# Patient Record
Sex: Female | Born: 1970 | Race: White | Hispanic: No | Marital: Married | Smoking: Never smoker
Health system: Southern US, Community
[De-identification: ages and names within clinical notes are randomized; demographics above are authoritative.]

## PROBLEM LIST (undated history)

## (undated) DIAGNOSIS — T7840XA Allergy, unspecified, initial encounter: Secondary | ICD-10-CM

## (undated) HISTORY — DX: Allergy, unspecified, initial encounter: T78.40XA

---

## 1998-05-19 ENCOUNTER — Inpatient Hospital Stay (HOSPITAL_COMMUNITY): Admission: AD | Admit: 1998-05-19 | Discharge: 1998-05-21 | Payer: Self-pay | Admitting: *Deleted

## 1998-06-15 ENCOUNTER — Encounter (HOSPITAL_COMMUNITY): Admission: RE | Admit: 1998-06-15 | Discharge: 1998-09-13 | Payer: Self-pay | Admitting: *Deleted

## 2000-08-15 ENCOUNTER — Other Ambulatory Visit: Admission: RE | Admit: 2000-08-15 | Discharge: 2000-08-15 | Payer: Self-pay | Admitting: Obstetrics and Gynecology

## 2001-03-23 ENCOUNTER — Inpatient Hospital Stay (HOSPITAL_COMMUNITY): Admission: AD | Admit: 2001-03-23 | Discharge: 2001-03-25 | Payer: Self-pay | Admitting: Obstetrics and Gynecology

## 2002-07-20 ENCOUNTER — Other Ambulatory Visit: Admission: RE | Admit: 2002-07-20 | Discharge: 2002-07-20 | Payer: Self-pay | Admitting: Family Medicine

## 2003-12-27 ENCOUNTER — Other Ambulatory Visit: Admission: RE | Admit: 2003-12-27 | Discharge: 2003-12-27 | Payer: Self-pay | Admitting: Internal Medicine

## 2003-12-28 ENCOUNTER — Encounter: Admission: RE | Admit: 2003-12-28 | Discharge: 2003-12-28 | Payer: Self-pay | Admitting: Family Medicine

## 2004-02-13 ENCOUNTER — Emergency Department (HOSPITAL_COMMUNITY): Admission: EM | Admit: 2004-02-13 | Discharge: 2004-02-13 | Payer: Self-pay | Admitting: Emergency Medicine

## 2004-06-09 DIAGNOSIS — E042 Nontoxic multinodular goiter: Secondary | ICD-10-CM | POA: Insufficient documentation

## 2005-02-19 ENCOUNTER — Ambulatory Visit: Payer: Self-pay | Admitting: Family Medicine

## 2005-04-03 ENCOUNTER — Ambulatory Visit: Payer: Self-pay | Admitting: Family Medicine

## 2005-04-03 ENCOUNTER — Other Ambulatory Visit: Admission: RE | Admit: 2005-04-03 | Discharge: 2005-04-03 | Payer: Self-pay | Admitting: Family Medicine

## 2005-04-05 ENCOUNTER — Encounter: Admission: RE | Admit: 2005-04-05 | Discharge: 2005-04-05 | Payer: Self-pay | Admitting: Family Medicine

## 2005-04-24 ENCOUNTER — Ambulatory Visit: Payer: Self-pay | Admitting: Endocrinology

## 2005-05-03 ENCOUNTER — Encounter: Admission: RE | Admit: 2005-05-03 | Discharge: 2005-05-03 | Payer: Self-pay | Admitting: Endocrinology

## 2005-05-03 ENCOUNTER — Encounter (INDEPENDENT_AMBULATORY_CARE_PROVIDER_SITE_OTHER): Payer: Self-pay | Admitting: *Deleted

## 2005-05-03 ENCOUNTER — Other Ambulatory Visit: Admission: RE | Admit: 2005-05-03 | Discharge: 2005-05-03 | Payer: Self-pay | Admitting: Interventional Radiology

## 2005-06-27 ENCOUNTER — Ambulatory Visit: Payer: Self-pay | Admitting: Family Medicine

## 2006-02-19 ENCOUNTER — Ambulatory Visit: Payer: Self-pay | Admitting: Family Medicine

## 2006-04-15 ENCOUNTER — Other Ambulatory Visit: Admission: RE | Admit: 2006-04-15 | Discharge: 2006-04-15 | Payer: Self-pay | Admitting: Family Medicine

## 2006-04-15 ENCOUNTER — Ambulatory Visit: Payer: Self-pay | Admitting: Family Medicine

## 2006-04-30 ENCOUNTER — Ambulatory Visit: Payer: Self-pay | Admitting: Family Medicine

## 2006-06-04 ENCOUNTER — Ambulatory Visit: Payer: Self-pay | Admitting: Family Medicine

## 2006-06-04 ENCOUNTER — Encounter (INDEPENDENT_AMBULATORY_CARE_PROVIDER_SITE_OTHER): Payer: Self-pay | Admitting: Specialist

## 2006-07-09 ENCOUNTER — Ambulatory Visit: Admission: RE | Admit: 2006-07-09 | Discharge: 2006-07-09 | Payer: Self-pay | Admitting: Gynecologic Oncology

## 2006-07-10 HISTORY — PX: OTHER SURGICAL HISTORY: SHX169

## 2006-09-09 ENCOUNTER — Encounter: Payer: Self-pay | Admitting: Family Medicine

## 2006-09-09 LAB — CONVERTED CEMR LAB

## 2006-09-10 ENCOUNTER — Ambulatory Visit: Admission: RE | Admit: 2006-09-10 | Discharge: 2006-09-10 | Payer: Self-pay | Admitting: Gynecologic Oncology

## 2007-04-11 ENCOUNTER — Encounter: Payer: Self-pay | Admitting: Family Medicine

## 2007-04-11 DIAGNOSIS — J309 Allergic rhinitis, unspecified: Secondary | ICD-10-CM | POA: Insufficient documentation

## 2007-04-27 DIAGNOSIS — L03119 Cellulitis of unspecified part of limb: Secondary | ICD-10-CM

## 2007-04-27 DIAGNOSIS — L02419 Cutaneous abscess of limb, unspecified: Secondary | ICD-10-CM | POA: Insufficient documentation

## 2007-04-30 ENCOUNTER — Ambulatory Visit: Payer: Self-pay | Admitting: Internal Medicine

## 2007-05-15 ENCOUNTER — Encounter (INDEPENDENT_AMBULATORY_CARE_PROVIDER_SITE_OTHER): Payer: Self-pay | Admitting: Internal Medicine

## 2007-05-15 ENCOUNTER — Other Ambulatory Visit: Admission: RE | Admit: 2007-05-15 | Discharge: 2007-05-15 | Payer: Self-pay | Admitting: Family Medicine

## 2007-05-15 ENCOUNTER — Ambulatory Visit: Payer: Self-pay | Admitting: Family Medicine

## 2007-05-15 DIAGNOSIS — B351 Tinea unguium: Secondary | ICD-10-CM | POA: Insufficient documentation

## 2007-05-15 DIAGNOSIS — N760 Acute vaginitis: Secondary | ICD-10-CM | POA: Insufficient documentation

## 2007-05-15 LAB — CONVERTED CEMR LAB
Pap Smear: NORMAL
Whiff Test: NEGATIVE

## 2007-05-19 LAB — CONVERTED CEMR LAB
BUN: 11 mg/dL (ref 6–23)
CO2: 27 meq/L (ref 19–32)
Calcium: 9.4 mg/dL (ref 8.4–10.5)
Chloride: 108 meq/L (ref 96–112)
Creatinine, Ser: 0.7 mg/dL (ref 0.4–1.2)
GFR calc Af Amer: 121 mL/min
GFR calc non Af Amer: 100 mL/min
Glucose, Bld: 88 mg/dL (ref 70–99)
Potassium: 4.2 meq/L (ref 3.5–5.1)
Sodium: 141 meq/L (ref 135–145)
TSH: 0.7 microintl units/mL (ref 0.35–5.50)

## 2007-05-26 ENCOUNTER — Telehealth (INDEPENDENT_AMBULATORY_CARE_PROVIDER_SITE_OTHER): Payer: Self-pay | Admitting: *Deleted

## 2007-06-17 ENCOUNTER — Encounter (INDEPENDENT_AMBULATORY_CARE_PROVIDER_SITE_OTHER): Payer: Self-pay | Admitting: Internal Medicine

## 2007-06-20 ENCOUNTER — Telehealth (INDEPENDENT_AMBULATORY_CARE_PROVIDER_SITE_OTHER): Payer: Self-pay | Admitting: *Deleted

## 2007-06-21 ENCOUNTER — Ambulatory Visit: Payer: Self-pay | Admitting: Family Medicine

## 2007-06-21 ENCOUNTER — Encounter: Payer: Self-pay | Admitting: Family Medicine

## 2007-06-26 ENCOUNTER — Encounter (INDEPENDENT_AMBULATORY_CARE_PROVIDER_SITE_OTHER): Payer: Self-pay | Admitting: Internal Medicine

## 2007-06-27 ENCOUNTER — Encounter (INDEPENDENT_AMBULATORY_CARE_PROVIDER_SITE_OTHER): Payer: Self-pay | Admitting: Internal Medicine

## 2007-07-02 ENCOUNTER — Telehealth (INDEPENDENT_AMBULATORY_CARE_PROVIDER_SITE_OTHER): Payer: Self-pay | Admitting: *Deleted

## 2007-08-08 ENCOUNTER — Encounter: Payer: Self-pay | Admitting: Family Medicine

## 2007-09-12 ENCOUNTER — Ambulatory Visit: Payer: Self-pay | Admitting: Family Medicine

## 2007-09-17 ENCOUNTER — Telehealth (INDEPENDENT_AMBULATORY_CARE_PROVIDER_SITE_OTHER): Payer: Self-pay | Admitting: Internal Medicine

## 2007-09-18 ENCOUNTER — Telehealth (INDEPENDENT_AMBULATORY_CARE_PROVIDER_SITE_OTHER): Payer: Self-pay | Admitting: Internal Medicine

## 2007-09-18 ENCOUNTER — Encounter (INDEPENDENT_AMBULATORY_CARE_PROVIDER_SITE_OTHER): Payer: Self-pay | Admitting: Internal Medicine

## 2007-09-23 ENCOUNTER — Encounter: Payer: Self-pay | Admitting: Family Medicine

## 2007-09-23 ENCOUNTER — Ambulatory Visit: Payer: Self-pay | Admitting: Family Medicine

## 2008-05-11 ENCOUNTER — Encounter (INDEPENDENT_AMBULATORY_CARE_PROVIDER_SITE_OTHER): Payer: Self-pay | Admitting: Internal Medicine

## 2008-05-11 LAB — CONVERTED CEMR LAB: Pap Smear: NORMAL

## 2008-05-25 ENCOUNTER — Encounter (INDEPENDENT_AMBULATORY_CARE_PROVIDER_SITE_OTHER): Payer: Self-pay | Admitting: Internal Medicine

## 2008-05-25 ENCOUNTER — Ambulatory Visit: Payer: Self-pay | Admitting: Family Medicine

## 2008-05-25 ENCOUNTER — Other Ambulatory Visit: Admission: RE | Admit: 2008-05-25 | Discharge: 2008-05-25 | Payer: Self-pay | Admitting: Family Medicine

## 2008-05-26 LAB — CONVERTED CEMR LAB
BUN: 10 mg/dL (ref 6–23)
Basophils Absolute: 0 10*3/uL (ref 0.0–0.1)
Basophils Relative: 0.9 % (ref 0.0–1.0)
CO2: 26 meq/L (ref 19–32)
Calcium: 8.8 mg/dL (ref 8.4–10.5)
Chloride: 108 meq/L (ref 96–112)
Cholesterol: 166 mg/dL (ref 0–200)
Creatinine, Ser: 0.9 mg/dL (ref 0.4–1.2)
Eosinophils Absolute: 0.2 10*3/uL (ref 0.0–0.7)
Eosinophils Relative: 3.4 % (ref 0.0–5.0)
GFR calc Af Amer: 90 mL/min
GFR calc non Af Amer: 74 mL/min
Glucose, Bld: 100 mg/dL — ABNORMAL HIGH (ref 70–99)
HCT: 43.1 % (ref 36.0–46.0)
HDL: 40.5 mg/dL (ref >39.0)
Hemoglobin: 14.8 g/dL (ref 12.0–15.0)
LDL Cholesterol: 109 mg/dL — ABNORMAL HIGH (ref 0–99)
Lymphocytes Relative: 17.8 % (ref 12.0–46.0)
MCHC: 34.5 g/dL (ref 30.0–36.0)
MCV: 90.3 fL (ref 78.0–100.0)
Monocytes Absolute: 0.4 10*3/uL (ref 0.1–1.0)
Monocytes Relative: 6.8 % (ref 3.0–12.0)
Neutro Abs: 3.7 10*3/uL (ref 1.4–7.7)
Neutrophils Relative %: 71.1 % (ref 43.0–77.0)
Platelets: 223 10*3/uL (ref 150–400)
Potassium: 4.2 meq/L (ref 3.5–5.1)
RBC: 4.77 M/uL (ref 3.87–5.11)
RDW: 12.4 % (ref 11.5–14.6)
Sodium: 140 meq/L (ref 135–145)
TSH: 0.71 microintl units/mL (ref 0.35–5.50)
Total CHOL/HDL Ratio: 4.1
Triglycerides: 83 mg/dL (ref 0–149)
VLDL: 17 mg/dL (ref 0–40)
WBC: 5.2 10*3/uL (ref 4.5–10.5)

## 2008-06-02 ENCOUNTER — Encounter (INDEPENDENT_AMBULATORY_CARE_PROVIDER_SITE_OTHER): Payer: Self-pay | Admitting: Internal Medicine

## 2008-06-02 ENCOUNTER — Encounter (INDEPENDENT_AMBULATORY_CARE_PROVIDER_SITE_OTHER): Payer: Self-pay | Admitting: *Deleted

## 2008-10-13 ENCOUNTER — Ambulatory Visit: Payer: Self-pay | Admitting: Family Medicine

## 2008-10-13 ENCOUNTER — Telehealth (INDEPENDENT_AMBULATORY_CARE_PROVIDER_SITE_OTHER): Payer: Self-pay | Admitting: Internal Medicine

## 2008-10-13 LAB — CONVERTED CEMR LAB: KOH Prep: NEGATIVE

## 2009-01-10 DIAGNOSIS — R220 Localized swelling, mass and lump, head: Secondary | ICD-10-CM | POA: Insufficient documentation

## 2009-01-10 DIAGNOSIS — R221 Localized swelling, mass and lump, neck: Secondary | ICD-10-CM | POA: Insufficient documentation

## 2009-01-14 ENCOUNTER — Telehealth (INDEPENDENT_AMBULATORY_CARE_PROVIDER_SITE_OTHER): Payer: Self-pay | Admitting: Internal Medicine

## 2009-01-30 ENCOUNTER — Emergency Department (HOSPITAL_COMMUNITY): Admission: EM | Admit: 2009-01-30 | Discharge: 2009-01-30 | Payer: Self-pay | Admitting: Emergency Medicine

## 2009-01-30 ENCOUNTER — Telehealth: Payer: Self-pay | Admitting: Internal Medicine

## 2009-02-22 HISTORY — PX: TONGUE BIOPSY: SHX1075

## 2009-02-23 ENCOUNTER — Ambulatory Visit: Payer: Self-pay | Admitting: Family Medicine

## 2009-02-23 DIAGNOSIS — J020 Streptococcal pharyngitis: Secondary | ICD-10-CM | POA: Insufficient documentation

## 2009-02-23 LAB — CONVERTED CEMR LAB: Rapid Strep: POSITIVE

## 2009-05-26 ENCOUNTER — Ambulatory Visit: Payer: Self-pay | Admitting: Family Medicine

## 2009-05-26 ENCOUNTER — Other Ambulatory Visit: Admission: RE | Admit: 2009-05-26 | Discharge: 2009-05-26 | Payer: Self-pay | Admitting: Family Medicine

## 2009-05-26 ENCOUNTER — Encounter (INDEPENDENT_AMBULATORY_CARE_PROVIDER_SITE_OTHER): Payer: Self-pay | Admitting: Internal Medicine

## 2009-05-26 LAB — CONVERTED CEMR LAB
ALT: 16 units/L (ref 0–35)
AST: 20 units/L (ref 0–37)
Albumin: 4.2 g/dL (ref 3.5–5.2)
Alkaline Phosphatase: 70 units/L (ref 39–117)
BUN: 14 mg/dL (ref 6–23)
Basophils Absolute: 0 10*3/uL (ref 0.0–0.1)
Basophils Relative: 0.1 % (ref 0.0–3.0)
Bilirubin, Direct: 0 mg/dL (ref 0.0–0.3)
CO2: 27 meq/L (ref 19–32)
Calcium: 9.1 mg/dL (ref 8.4–10.5)
Chloride: 108 meq/L (ref 96–112)
Cholesterol: 189 mg/dL (ref 0–200)
Creatinine, Ser: 0.9 mg/dL (ref 0.4–1.2)
Eosinophils Absolute: 0 10*3/uL (ref 0.0–0.7)
Eosinophils Relative: 0.4 % (ref 0.0–5.0)
GFR calc non Af Amer: 74.01 mL/min (ref >60)
Glucose, Bld: 90 mg/dL (ref 70–99)
HCT: 42.8 % (ref 36.0–46.0)
HDL: 49.1 mg/dL (ref >39.00)
Hemoglobin: 15.3 g/dL — ABNORMAL HIGH (ref 12.0–15.0)
LDL Cholesterol: 119 mg/dL — ABNORMAL HIGH (ref 0–99)
Lymphocytes Relative: 16.6 % (ref 12.0–46.0)
Lymphs Abs: 0.9 10*3/uL (ref 0.7–4.0)
MCHC: 35.8 g/dL (ref 30.0–36.0)
MCV: 86.7 fL (ref 78.0–100.0)
Monocytes Absolute: 0.3 10*3/uL (ref 0.1–1.0)
Monocytes Relative: 5.1 % (ref 3.0–12.0)
Neutro Abs: 4.5 10*3/uL (ref 1.4–7.7)
Neutrophils Relative %: 77.8 % — ABNORMAL HIGH (ref 43.0–77.0)
Platelets: 224 10*3/uL (ref 150.0–400.0)
Potassium: 4.3 meq/L (ref 3.5–5.1)
RBC: 4.93 M/uL (ref 3.87–5.11)
RDW: 12.3 % (ref 11.5–14.6)
Sodium: 142 meq/L (ref 135–145)
TSH: 0.59 microintl units/mL (ref 0.35–5.50)
Total Bilirubin: 0.8 mg/dL (ref 0.3–1.2)
Total CHOL/HDL Ratio: 4
Total Protein: 7.1 g/dL (ref 6.0–8.3)
Triglycerides: 103 mg/dL (ref 0.0–149.0)
VLDL: 20.6 mg/dL (ref 0.0–40.0)
WBC: 5.7 10*3/uL (ref 4.5–10.5)

## 2009-05-31 ENCOUNTER — Encounter (INDEPENDENT_AMBULATORY_CARE_PROVIDER_SITE_OTHER): Payer: Self-pay | Admitting: *Deleted

## 2009-06-21 ENCOUNTER — Telehealth (INDEPENDENT_AMBULATORY_CARE_PROVIDER_SITE_OTHER): Payer: Self-pay | Admitting: Internal Medicine

## 2009-07-26 ENCOUNTER — Telehealth (INDEPENDENT_AMBULATORY_CARE_PROVIDER_SITE_OTHER): Payer: Self-pay | Admitting: Internal Medicine

## 2009-07-28 ENCOUNTER — Telehealth (INDEPENDENT_AMBULATORY_CARE_PROVIDER_SITE_OTHER): Payer: Self-pay | Admitting: Internal Medicine

## 2010-01-03 ENCOUNTER — Ambulatory Visit: Payer: Self-pay | Admitting: Family Medicine

## 2010-01-03 DIAGNOSIS — N390 Urinary tract infection, site not specified: Secondary | ICD-10-CM | POA: Insufficient documentation

## 2010-01-03 DIAGNOSIS — R300 Dysuria: Secondary | ICD-10-CM | POA: Insufficient documentation

## 2010-01-03 LAB — CONVERTED CEMR LAB
Bilirubin Urine: NEGATIVE
Glucose, Urine, Semiquant: NEGATIVE
Ketones, urine, test strip: NEGATIVE
Nitrite: NEGATIVE
Protein, U semiquant: NEGATIVE
Specific Gravity, Urine: >=1.03
Urobilinogen, UA: 0.2
WBC Urine, dipstick: NEGATIVE
pH: 6

## 2010-01-05 ENCOUNTER — Encounter: Payer: Self-pay | Admitting: Family Medicine

## 2010-03-27 ENCOUNTER — Ambulatory Visit: Payer: Self-pay | Admitting: Family Medicine

## 2010-03-27 LAB — CONVERTED CEMR LAB: Urobilinogen, UA: 0.2

## 2010-03-28 ENCOUNTER — Encounter: Payer: Self-pay | Admitting: Family Medicine

## 2010-05-22 ENCOUNTER — Telehealth (INDEPENDENT_AMBULATORY_CARE_PROVIDER_SITE_OTHER): Payer: Self-pay | Admitting: *Deleted

## 2010-05-23 ENCOUNTER — Ambulatory Visit: Payer: Self-pay | Admitting: Family Medicine

## 2010-05-24 LAB — CONVERTED CEMR LAB
ALT: 16 units/L (ref 0–35)
AST: 16 units/L (ref 0–37)
Albumin: 4.2 g/dL (ref 3.5–5.2)
Alkaline Phosphatase: 61 units/L (ref 39–117)
BUN: 14 mg/dL (ref 6–23)
Bilirubin, Direct: 0.1 mg/dL (ref 0.0–0.3)
CO2: 25 meq/L (ref 19–32)
Calcium: 9.1 mg/dL (ref 8.4–10.5)
Chloride: 109 meq/L (ref 96–112)
Cholesterol: 186 mg/dL (ref 0–200)
Creatinine, Ser: 0.8 mg/dL (ref 0.4–1.2)
GFR calc non Af Amer: 80.85 mL/min (ref >60)
Glucose, Bld: 89 mg/dL (ref 70–99)
HDL: 46.1 mg/dL (ref >39.00)
LDL Cholesterol: 119 mg/dL — ABNORMAL HIGH (ref 0–99)
Potassium: 4.4 meq/L (ref 3.5–5.1)
Sodium: 142 meq/L (ref 135–145)
Total Bilirubin: 0.5 mg/dL (ref 0.3–1.2)
Total CHOL/HDL Ratio: 4
Total Protein: 6.6 g/dL (ref 6.0–8.3)
Triglycerides: 103 mg/dL (ref 0.0–149.0)
VLDL: 20.6 mg/dL (ref 0.0–40.0)

## 2010-05-26 ENCOUNTER — Ambulatory Visit: Payer: Self-pay | Admitting: Family Medicine

## 2010-05-26 ENCOUNTER — Other Ambulatory Visit: Admission: RE | Admit: 2010-05-26 | Discharge: 2010-05-26 | Payer: Self-pay | Admitting: Family Medicine

## 2010-05-26 DIAGNOSIS — N898 Other specified noninflammatory disorders of vagina: Secondary | ICD-10-CM | POA: Insufficient documentation

## 2010-05-26 LAB — CONVERTED CEMR LAB
KOH Prep: NEGATIVE
Whiff Test: NEGATIVE

## 2010-06-02 ENCOUNTER — Encounter: Admission: RE | Admit: 2010-06-02 | Discharge: 2010-06-02 | Payer: Self-pay | Admitting: Family Medicine

## 2010-06-30 LAB — CONVERTED CEMR LAB: Pap Smear: NEGATIVE

## 2011-01-11 NOTE — Progress Notes (Signed)
Summary: Lab orders from 05/22/10 canceled lab  ---- Converted from flag ---- ---- 05/19/2010 1:56 PM, Ruthe Mannan MD wrote: BMET (V70.0), Fasting lipid, hepatic (V77.91)  ---- 05/19/2010 1:55 PM, Liane Comber CMA (AAMA) wrote: Hello! Pt is scheduled for cpx labs tomorrow, what labs to draw and dx codes? Thanks Tasha ------------------------------

## 2011-01-11 NOTE — Assessment & Plan Note (Signed)
Summary: YEARLY PHYSICAL/JRR   Vital Signs:  Patient profile:   40 year old female Height:      66.5 inches Weight:      178.38 pounds BMI:     28.46 Temp:     98 degrees F oral Pulse rate:   96 / minute Pulse rhythm:   regular BP sitting:   138 / 86  (left arm) Cuff size:   regular  Vitals Entered By: Lewanda Rife LPN (May 26, 2010 9:02 AM) CC: CPX LMP 03/2010   History of Present Illness: 40 yo here for CPX.  Well woman- no h/o abnormal pap smears. 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 No family h/o cervical, uterine or breast CA.  Due for pap today, tetanus. Up to date on all other prevetion.  Vaginal discharge- thick, white, itchy since she stopped her abx for UTI. No pain with intercourse, fever or chills.   Current Medications (verified): 1)  Seasonale 0.15-0.03 Mg Tabs (Levonorgest-Eth Estrad 91-Day) .... Take 1 Daily 2)  Azo Standard Maximum Strength 97.5 Mg Tabs (Phenazopyridine Hcl) .... Otc As Directed. 3)  Aleve 220 Mg Tabs (Naproxen Sodium) .... Otc As Directed. 4)  Clobetasol Propionate 0.05 % Oint (Clobetasol Propionate) .... Apply Once Daily To Vaginal Area As Needed 5)  Diflucan 150 Mg Tabs (Fluconazole) .Marland Kitchen.. 1 Tab By Mouth By Mouth X 1  Allergies (verified): No Known Drug Allergies  Past History:  Past Medical History: Last updated: 04/11/2007 Allergic rhinitis  Past Surgical History: Last updated: 02/23/2009 thyroid US neg. goiter, nodule vulvar dysplasia 07/2006 bx of tongue lesion 02/22/2009--oral surgeon  Family History: Last updated: 05/26/2009 Father: L&W Mother: L&W Siblings: 3 br--1 type 2 DM, obesity                        2 L&W              1 sis --L&W  DM- P uncle MI- Puncle in 62's CVA- 0 Prostate Cancer-0 Breast Cancer- 0 Ovarian Cancer- 0 Uterine Cancer- 0 Colon Cancer- 0 Drug/ ETOH Abuse-MGF--ETOH Depression- 0  both grandmothers died of CHF at advanced ages  Social History: Last updated:  05/25/2008 Marital Status: Married Children: 2 Occupation: Research officer, trade union --pre-K and K  Risk Factors: Caffeine Use: 0 (05/26/2009) Exercise: no (05/25/2008)  Risk Factors: Smoking Status: never (04/11/2007) Passive Smoke Exposure: no (05/25/2008)  Review of Systems      See HPI General:  Denies chills and fever. Eyes:  Denies blurring. ENT:  Denies difficulty swallowing. CV:  Denies chest pain or discomfort. Resp:  Denies shortness of breath. GI:  Denies abdominal pain, bloody stools, and change in bowel habits. GU:  Complains of discharge; denies abnormal vaginal bleeding and dysuria. MS:  Denies joint pain, joint redness, and joint swelling. Derm:  Denies rash. Neuro:  Denies headaches. Psych:  Denies anxiety and depression. Endo:  Denies cold intolerance and heat intolerance.  Physical Exam  General:  alert, well-developed, well-nourished, and well-hydrated.   Head:  normocephalic, atraumatic, and no abnormalities observed.   Eyes:  vision grossly intact, pupils equal, pupils round, and pupils reactive to light.   Ears:  R ear normal and L ear normal.   Nose:  no external deformity.   Mouth:  good dentition.   Lungs:  Normal respiratory effort, chest expands symmetrically. Lungs are clear to auscultation, no crackles or wheezes. Heart:  Normal rate and regular rhythm. S1 and S2 normal without gallop, murmur,  click, rub or other extra sounds. Abdomen:  Bowel sounds positive,abdomen soft and non-tender without masses, organomegaly or hernias noted. Genitalia:  Pelvic Exam:        External: normal female genitalia, mild vulvular erythema        Vagina: normal without lesions or masses        Cervix: normal without lesions or masses, thick white discharge in os        Adnexa: normal bimanual exam without masses or fullness        Uterus: normal by palpation        Pap smear: performed Msk:  No deformity or scoliosis noted of thoracic or lumbar spine.   Extremities:   No clubbing, cyanosis, edema, or deformity noted with normal full range of motion of all joints.   Skin:  Intact without suspicious lesions or rashes Psych:  normally interactive and good eye contact.     Impression & Recommendations:  Problem # 1:  WELL WOMAN (ICD-V70.0) Reviewed preventive care protocols, scheduled due services, and updated immunizations Discussed nutrition, exercise, diet, and healthy lifestyle.  Set up mammogram today.  Problem # 2:  VAGINAL DISCHARGE (ICD-623.5) Assessment: New Wet prep consistent with yeast, diflucan 150 mg by mouth x 1. Orders: Wet Prep ([id number])  Complete Medication List: 1)  Seasonale 0.15-0.03 Mg Tabs (Levonorgest-eth estrad 91-day) .... Take 1 daily 2)  Azo Standard Maximum Strength 97.5 Mg Tabs (Phenazopyridine hcl) .... Otc as directed. 3)  Aleve 220 Mg Tabs (Naproxen sodium) .... Otc as directed. 4)  Clobetasol Propionate 0.05 % Oint (Clobetasol propionate) .... Apply once daily to vaginal area as needed 5)  Diflucan 150 Mg Tabs (Fluconazole) .Marland Kitchen.. 1 tab by mouth by mouth x 1  Other Orders: Radiology Referral (Radiology)  Patient Instructions: 1)  Great to see you. 2)  Please stop by to see Shirlee Limerick on your way out to set up your mammogram. Prescriptions: DIFLUCAN 150 MG TABS (FLUCONAZOLE) 1 tab by mouth by mouth x 1  #1 x 0   Entered and Authorized by:   Ruthe Mannan MD   Signed by:   Ruthe Mannan MD on 05/26/2010   Method used:   [facility]        [facility] [id number]       [address]       [place], [place]  [address]       Ph: [phone] or [phone]       Fax: [phone]   RxID:   [id number]   Current Allergies (reviewed today): No known allergies     Prevention & Chronic Care Immunizations   Influenza vaccine: Not documented    Tetanus booster: 12/10/1994: Td   Td booster deferral: Not indicated  (05/26/2010)   Tetanus booster due: 12/10/2004    Pneumococcal vaccine: Not  documented  Other Screening   Pap smear: NEGATIVE FOR INTRAEPITHELIAL LESIONS OR MALIGNANCY.  (05/26/2009)   Pap smear action/deferral: Ordered  (05/26/2010)   Pap smear due: 05/2009    Mammogram: Not documented   Mammogram action/deferral: Ordered  (05/26/2010)   Smoking status: never  (04/11/2007)  Lipids   Total Cholesterol: 186  (05/23/2010)   LDL: 119  (05/23/2010)   LDL Direct: Not documented   HDL: 46.10  (05/23/2010)   Triglycerides: 103.0  (05/23/2010)   Lipid panel due: 05/27/2011   Nursing Instructions: Pap smear today Schedule screening mammogram (see order)   Laboratory Results    Wet [place]/KOH Source: vaginal WBC/hpf 5-10  Negative whiff Yeast/hpf many KOH Negative Trichomonas/hpf none

## 2011-01-11 NOTE — Assessment & Plan Note (Signed)
Summary: UTI / LFW   Vital Signs:  Patient profile:   40 year old female Height:      66.5 inches Weight:      181.38 pounds BMI:     28.94 Temp:     97.9 degrees F oral Pulse rate:   84 / minute Pulse rhythm:   regular BP sitting:   144 / 90  (left arm) Cuff size:   regular  Vitals Entered By: Lewanda Rife LPN (March 27, 2010 2:50 PM) CC: ?UTI burning and pain upon urination with frequency   History of Present Illness: 40 yo with 1 day of dysuria, increased urinary frequency, and suprapubic pressure. Taking Azo with relief of symptoms. No fevers, chills, nausea, vomiting or back pain.  Similar symptoms in January.  Prior to January, cannot remember last time she had UTI.  Current Medications (verified): 1)  Seasonale 0.15-0.03 Mg Tabs (Levonorgest-Eth Estrad 91-Day) .... Take 1 Daily 2)  Nystatin 100000 Unit/gm Crea (Nystatin) .... Apply Two Times A Day To Vaginal Area As Needed 3)  Azo Standard Maximum Strength 97.5 Mg Tabs (Phenazopyridine Hcl) .... Otc As Directed. 4)  Aleve 220 Mg Tabs (Naproxen Sodium) .... Otc As Directed. 5)  Cipro 500 Mg Tabs (Ciprofloxacin Hcl) .Marland Kitchen.. 1 By Mouth 2 Times Daily X 7 Days  Allergies (verified): No Known Drug Allergies  Review of Systems      See HPI General:  Denies chills and fever. GI:  Denies abdominal pain, nausea, and vomiting. GU:  Complains of dysuria and urinary frequency; denies hematuria, incontinence, and urinary hesitancy.  Physical Exam  General:  alert, well-developed, well-nourished, and well-hydrated.   Abdomen:  mild suprapubic tenderness NO CVA tenderness Psych:  normally interactive and good eye contact.     Impression & Recommendations:  Problem # 1:  DYSURIA (ICD-788.1) Assessment New Likely UTI.  Unable to read UA due to Azo.  Will send for culture.   Treat with Cipro 500 mg two times a day x 7 days. Her updated medication list for this problem includes:    Azo Standard Maximum Strength 97.5 Mg Tabs  (Phenazopyridine hcl) ..... Otc as directed.    Cipro 500 Mg Tabs (Ciprofloxacin hcl) .Marland Kitchen... 1 by mouth 2 times daily x 7 days  Orders: T-Culture, Urine ([id number])  Complete Medication List: 1)  Seasonale 0.15-0.03 Mg Tabs (Levonorgest-eth estrad 91-day) .... Take 1 daily 2)  Nystatin 100000 Unit/gm Crea (Nystatin) .... Apply two times a day to vaginal area as needed 3)  Azo Standard Maximum Strength 97.5 Mg Tabs (Phenazopyridine hcl) .... Otc as directed. 4)  Aleve 220 Mg Tabs (Naproxen sodium) .... Otc as directed. 5)  Cipro 500 Mg Tabs (Ciprofloxacin hcl) .Marland Kitchen.. 1 by mouth 2 times daily x 7 days Prescriptions: CIPRO 500 MG TABS (CIPROFLOXACIN HCL) 1 by mouth 2 times daily x 7 days  #14 x 0   Entered and Authorized by:   Ruthe Mannan MD   Signed by:   Ruthe Mannan MD on 03/27/2010   Method used:   [facility]        [facility]       [address]       [place], [place]  [address]       Ph: [phone] or [phone]       Fax: [phone]   RxID:   [id number]   Current Allergies (reviewed today): No known allergies   Laboratory Results   Urine Tests  Date/Time Received:  March 27, 2010 2:56 PM  Date/Time Reported: March 27, 2010 2:56 PM   Routine Urinalysis   Color: orange Appearance: orange Glucose: orange   (Normal Range: Negative) Bilirubin: orange   (Normal Range: Negative) Ketone: orange   (Normal Range: Negative) Spec. Gravity: orange   (Normal Range: 1.003-1.035) Blood: orange   (Normal Range: Negative) pH: orange   (Normal Range: 5.0-8.0) Protein: orange   (Normal Range: Negative) Urobilinogen: 0.2   (Normal Range: 0-1) Nitrite: orange   (Normal Range: Negative) Leukocyte Esterace: orange   (Normal Range: Negative)        Appended Document: UTI / LFW

## 2011-01-11 NOTE — Assessment & Plan Note (Signed)
Summary: has uti   Vital Signs:  Patient profile:   40 year old female Height:      66.5 inches Weight:      178 pounds BMI:     28.40 Temp:     97.7 degrees F oral Pulse rate:   104 / minute Pulse rhythm:   regular BP sitting:   130 / 84  (left arm) Cuff size:   regular  Vitals Entered By: Delilah Shan CMA Duncan Dull) (January 03, 2010 8:59 AM) CC: ? UTI   History of Present Illness: 40 yo with 2 days of increased urinary frequency, dysuria, suprapubic pressure. Felt a little nauseated this morning, no vomiting. Mild right sided back pain when urinating. No fevers.   Has not had a UTI in over 2 years.  Current Medications (verified): 1)  Seasonale 0.15-0.03 Mg Tabs (Levonorgest-Eth Estrad 91-Day) .... Take 1 Daily 2)  Nystatin 100000 Unit/gm Crea (Nystatin) .... Apply Two Times A Day To Vaginal Area As Needed 3)  Naftin 1 % Crea (Naftifine Hcl) .... Apply Once Daily To Toenail As Needed 4)  Ketoconazole 2 % Crea (Ketoconazole) .... Apply To Rash Daily For 2-3 Weeks, Then As Needed 5)  Cipro 500 Mg Tabs (Ciprofloxacin Hcl) .Marland Kitchen.. 1 By Mouth 2 Times Daily X 7 Days  Allergies (verified): No Known Drug Allergies  Review of Systems      See HPI General:  Denies chills and fever. GI:  Complains of nausea; denies vomiting. GU:  Complains of dysuria and urinary frequency; denies hematuria and incontinence.  Physical Exam  General:  alert, well-developed, well-nourished, and well-hydrated.   Mouth:  MMM Abdomen:  mild suprapubic tenderness NO CVA tenderness Psych:  normally interactive and good eye contact.     Impression & Recommendations:  Problem # 1:  UTI (ICD-599.0) Assessment New UA pos for blood only.  Will treat for UTI as she is symptomatic and send for culture.  See patient instructions for details. Her updated medication list for this problem includes:    Cipro 500 Mg Tabs (Ciprofloxacin hcl) .Marland Kitchen... 1 by mouth 2 times daily x 7 days  Complete Medication  List: 1)  Seasonale 0.15-0.03 Mg Tabs (Levonorgest-eth estrad 91-day) .... Take 1 daily 2)  Nystatin 100000 Unit/gm Crea (Nystatin) .... Apply two times a day to vaginal area as needed 3)  Naftin 1 % Crea (Naftifine hcl) .... Apply once daily to toenail as needed 4)  Ketoconazole 2 % Crea (Ketoconazole) .... Apply to rash daily for 2-3 weeks, then as needed 5)  Cipro 500 Mg Tabs (Ciprofloxacin hcl) .Marland Kitchen.. 1 by mouth 2 times daily x 7 days  Other Orders: T-Culture, Urine ([id number])  Patient Instructions: 1)  Drink plenty of fluids up to 3-4 quarts a day. Cranberry juice is especially recommended in addition to large amounts of water. Avoid caffeine & carbonated drinks, they tend to irritate the bladder, Return in 3-5 days if you're not better: sooner if you're feeling worse.  Prescriptions: CIPRO 500 MG TABS (CIPROFLOXACIN HCL) 1 by mouth 2 times daily x 7 days  #14 x 0   Entered and Authorized by:   Ruthe Mannan MD   Signed by:   Ruthe Mannan MD on 01/03/2010   Method used:   Electronically to        [facility] [id number] (retail)       [address]       [place],  [place]  [address]       Ph: [phone]       Fax: [phone]   RxID:   [id number]   Current Allergies (reviewed today): No known allergies  Laboratory Results   Urine Tests   Date/Time Reported: January 03, 2010 9:12 AM   Routine Urinalysis   Color: orange Appearance: Hazy Glucose: negative   (Normal Range: Negative) Bilirubin: negative   (Normal Range: Negative) Ketone: negative   (Normal Range: Negative) Spec. Gravity: >=1.030   (Normal Range: 1.003-1.035) Blood: trace-intact   (Normal Range: Negative) pH: 6.0   (Normal Range: 5.0-8.0) Protein: negative   (Normal Range: Negative) Urobilinogen: 0.2   (Normal Range: 0-1) Nitrite: negative   (Normal Range: Negative) Leukocyte Esterace: negative   (Normal Range: Negative)

## 2011-03-27 LAB — POCT RAPID STREP A (OFFICE): Streptococcus, Group A Screen (Direct): POSITIVE — AB

## 2011-04-27 NOTE — Group Therapy Note (Signed)
[name], [name]                 ACCOUNT NO.:  [id number]   MEDICAL RECORD NO.:  [id number]          PATIENT TYPE:  POB   LOCATION:  [facility]                   FACILITY:  [facility]   PHYSICIAN:  Tinnie Gens, MD        DATE OF BIRTH:  06/15/1971   DATE OF SERVICE:  06/04/2006                                    CLINIC NOTE   CHIEF COMPLAINT:  Follow up.   The patient is a 40 year old, gravida 2, para 2, who was referred by Dr.  Milinda Antis for persistent yeast, who was treated with numerous medications on  several occasions for this yeast. I had previously sent her on ketoconazole  10 mg tablets for five days, Terazol cream and a three week course of every  other day of Diflucan. However, the patient continues to have similar  symptoms with vulvar itching, irritation, seems to be just on the side.  Additionally she has an onychomycosis that she thinks might be related to  this.  She is __________ .  She is trying to see currently if her pharmacy  will pay for Lamisil.  Given that she is no better it was thought that the  patient would be best served by punch biopsy today. This patient is seen at  [facility].   PROCEDURE:  The right labia majora was infiltrated with 1 mL of 1%  lidocaine. A punch biopsy was obtained without difficulty. Hemostasis was  obtained with silver nitrate. The patient tolerated the procedure well and  the biopsy was sent off for pathology.   IMPRESSION:  Vulvar irritation. Question resistant yeast. She has failed  numerous treatments. Will send the biopsy to confirm that diagnosis.   PLAN:  Will call the patient back with the diagnosis as soon as the  pathology returns and follow up treatment as needed from there.           ______________________________  Tinnie Gens, MD     TP/MEDQ  D:  06/04/2006  T:  06/04/2006  Job:  [id number]

## 2011-04-27 NOTE — Consult Note (Signed)
NAMEJULIETA, [name]                 ACCOUNT NO.:  [id number]   MEDICAL RECORD NO.:  [id number]          PATIENT TYPE:  OUT   LOCATION:  GYN                          FACILITY:  [facility]   PHYSICIAN:  [name]    DATE OF BIRTH:  [birth year]   DATE OF CONSULTATION:  07/09/2006  DATE OF DISCHARGE:                                   CONSULTATION   REFERRING PHYSICIAN:  Tanya S. Shawnie Carr, M.D.   PRIMARY PHYSICIAN:  [name], M.D.   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  [name] is a 40 year old gravida 2, para 2 was last cycle was a  week ago.  She states that she has been complaining of about a 1-year  history of discomfort on the right side which was felt to be most likely  secondary to yeast.  She has been treated for fungal infection several times  without resolution of her symptoms.  She was seen by Dr. 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  She did  have a normal Pap smear in May 2007.  Other than the itching, she is doing  fine.  She does admit to scratching significantly though never to the point  of causing any bleeding.  She states she scratches quite a bit at night  time.  There is really no significant aggravating or alleviating symptoms.  Other than the fact that she really has minimal to no discharge, it does  feel like the yeast infection otherwise.   MEDICATIONS:  Seasonale.   ALLERGIES:  None.   PAST SURGICAL HISTORY:  Two spontaneous vaginal deliveries.   PAST MEDICAL HISTORY:  None.   SOCIAL HISTORY:  She is married.  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  She has an 67-year-old son and 49-year-old  daughter.  She denies use of tobacco or alcohol.   FAMILY HISTORY:  Her mother and father both have hypertension.  Her father  had peptic ulcer disease.  She has  a brother with diabetes.   PHYSICAL EXAMINATION:  Weight 161 pounds.  Well-nourished, alert female in  no acute distress.  VULVA:  There is some erythema and edema consistent with itch-scratch cycle  on the right vulva.  Acetic acid was applied and the entire vulva was  closely inspected.  There is a small approximately 6 to 7 mm acetowhite  epithelial change in the inferior aspect of the right vulva.  Remainder the  vulva has no acetowhite epithelial changes.   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   PLAN:  I instructed her and she was shown her vulva with a mirror today to  use Aldara three times a week on the lower small portion of the vulva that  does have the  low grade dysplasia.  She will use it for 6 weeks time.  I did  counsel her that this may increase her symptoms for a period time, but then  they should get better with regards to this.  With regards to the more port  of prominent portion of the vulvitis, I believe this is more of a dermatitis  and a chronic itch and scratch.  For this I have encouraged her to use Dove  hypoallergenic soap, to avoid tight-fitting clothes.  We also gave her  prescription for Lotrisone which does complain of small antifungal as well  as steroid that she is to use.  She was counseled again trying to not  scratch as much as possible.  Her questions were answered and elicited to  her satisfaction.  She is very pleased that she will not have to consider  surgery at this standpoint.  She was to return to see me in 6 to 8 weeks for  follow-up.      [name]  Electronically Signed     PAG/MEDQ  D:  07/09/2006  T:  07/09/2006  Job:  [id number]   cc:   Telford Nab, R.N.  [address]  [place], [place] [address]   Idamae Schuller A. Tower, M.D. [facility]  [address] [place]  [place] [address]   Shelbie Proctor. Shawnie Carr, M.D.

## 2011-04-27 NOTE — H&P (Signed)
[facility]  Patient:    [name], [name]                          MRN: [id number] Adm. Date:  03/23/01 Attending:  Silverio Lay, M.D.                         History and Physical  REASON FOR ADMISSION:         Intrauterine pregnancy, at 40 weeks, with regular uterine contractions.  HISTORY OF PRESENT ILLNESS:   This is a 40 year old married, white female, gravida 2, para 1 with an due date of March 23, 2001, by ultrasound, who reports regular uterine contractions since 1:30 a.m.  She denies any leaking of fluid.  She denies any bleeding and reports good fetal activity.  She also denies any symptoms of pregnancy-induced hypertension.  She arrived at maternity admission at 4:30 a.m. with uterine contractions every 2-3 minutes, intensity 7-8/10.  Her vaginal examination, by the admitting nurse is, 4-5 cm, 90% effaced, 0 vertex.  PERTINENT LABORATORY DATA:    Reveals blood type B positive.  RPR nonreactive. Rubella-immune.  HBsAg nonreactive.  HIV nonreactive.  Chlamydia and gonorrhea negative.  A 16-week AFP within normal limits.  A 20-week ultrasound with a normal anatomy survey.  Anterior placenta and cervical length of 5.7 cm.  A 28-week glucose tolerance test was within normal limits and 35-week group B strep was negative.  Prenatal course was otherwise uneventful.  ALLERGIES:                    No known drug allergies.  PAST MEDICAL HISTORY:         June of 1999, vaginal delivery with low forceps at 40 weeks of a female infant, weighing 8 pounds 9 ounces.  FAMILY HISTORY:               Father borderline hypertension and diabetes. Brother with diabetes.  SOCIAL HISTORY:               Married, nonsmoker, works as a Diplomatic Services operational officer.  PHYSICAL EXAMINATION:  VITAL SIGNS:                  Vital signs normal with blood pressure 122/80, temperature normal.  HEENT:                        Head, eyes, ears, nose, and throat negative.  LUNGS:                         Clear.  HEART:                        Normal.  ABDOMEN:                      Gravid, nontender.  PELVIC:                       Vaginal examination 4-5 cm, 90% effaced, vertex, 0, bulging bag of membranes.  Fetal heart rate tracing reactive.  EXTREMITIES:                  Negative.  ASSESSMENT:                   Intrauterine pregnancy, at 40 weeks, instantaneous labor  with previous history of forceps delivery for large for gestational age.  PLAN:                         The patient is admitted to labor and delivery with routine orders.  May have epidural, spontaneous vaginal delivery expected. DD:  03/23/01 TD:  03/23/01 Job: [id number]

## 2011-04-27 NOTE — Consult Note (Signed)
NAMECORLEY, KOHLS                 ACCOUNT NO.:  [id number]   MEDICAL RECORD NO.:  [id number]          PATIENT TYPE:  OUT   LOCATION:  GYN                          FACILITY:  [facility]   PHYSICIAN:  Paola A. Duard Brady, MD    DATE OF BIRTH:  [birth year]   DATE OF CONSULTATION:  09/10/2006  DATE OF DISCHARGE:                                   CONSULTATION   HISTORY OF PRESENT ILLNESS:  Ms. Thomaston is a very pleasant 40 year old who was  initially seen by me on July 09, 2006.  She was referred by Dr. Shawnie Pons  secondary to a vulvar biopsy that revealed VIN.  At that time, the patient  was having significant vulvar symptoms.  On examination, she had significant  erythema and edema consistent with itch and scratch on the right vulva.  She  had a 6-7 mm acetowhite epithelial change on the inferior aspect of the  right vulva.  We recommended Aldara to that small lesion, as well as  Lotrisone cream and discontinuation of irritants.  She comes in today, and  she states that she is feeling much better.  She used the Aldara with no  significant symptoms.  She states that she believes the lesion is now gone.  She did use the Lotrisone for several days.  Her symptoms resolved.  They  again returned a couple of weeks ago for a day or two.  She used the  Lotrisone cream sparingly, and the symptoms again resolved.  She is overall  doing quite well and denies any significant complaints.   PHYSICAL EXAMINATION:  VITAL SIGNS:  Weight 159 pounds, blood pressure  118/80.  GENERAL:  Well-nourished, well-developed female in no acute distress.  PELVIC:  External genitalia - the vulva are much paler in appearance.  There  is no significant edema, overall appearing much healthier and normal.  Acetic acid was applied to the vulva, and there were no acetowhite  epithelial changes.   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   PLAN:  There is no evidence of recurrent or persistent dysplasia at this  point.  I have  discussed with the patient on doing self vulvar checks once a  month.  If she notices any lesions, she may either bring them up to Dr.  Tawni Levy attention or she can feel free to contact us.  In addition, I have  continued to encourage her to stop vulvar irritants.  She was using [place],  but now is not even using any soap when she bathes, and she states that that  may have had a  significant role in her symptomatic relief.  She can continue using the  Lotrisone cream sparingly.  We will release her from our office.  She knows  we will be happy to see her in the future should the need arise, but, in the  meantime, she will of course be following up with Dr. Shawnie Pons and Dr. Milinda Antis.      Paola A. Duard Brady, MD  Electronically Signed     PAG/MEDQ  D:  09/10/2006  T:  09/11/2006  Job:  [id number]   cc:   Marne A. Tower, MD  [address] [place], [place] [address]   Shelbie Proctor. Shawnie Pons, M.D.   Telford Nab, R.N.  [address]  [place], [place] [address]

## 2011-04-27 NOTE — Group Therapy Note (Signed)
[name], [name]                 ACCOUNT NO.:  [id number]   MEDICAL RECORD NO.:  [id number]          PATIENT TYPE:  WOC   LOCATION:  [facility]                   FACILITY:  [facility]   PHYSICIAN:  Tinnie Gens, MD        DATE OF BIRTH:  Apr 21, 1971   DATE OF SERVICE:  04/30/2006                                    CLINIC NOTE   CHIEF COMPLAINT:  Vulvar irritation.   HISTORY OF PRESENT ILLNESS:  The patient is a 40 year old, gravida 2, para  2, who is referred from Dr. Milinda Antis for resistant yeast.  She apparently has a  several month history of yeast.  She has gone through Diflucan, Monistat,  Viasorb, Desitin cream and none of these have completely made the irritation  better.  It seems to be just on the right side.  It is associated with  itching, irritation.  She denies vaginal discharge or problems with vaginal  irritation.  The patient has a history of vaginal yeast but never on the  outside.  The patient has had no recent history of antibiotics.  She says  she was treated for strep almost a year ago.  The patient had relief at one  time with Monistat three a day; however, it came right back and since not  helped.  Dr. Milinda Antis has done a scraping of this area and said it definitely  looks like yeast.  The patient is on Tri-Sprintec for birth control and has  had no problems with that.   PAST MEDICAL HISTORY:  Significant for goiter.   PAST SURGICAL HISTORY:  Negative.   MEDICATIONS:  Tri-Sprintec daily.   ALLERGIES:  None known.   OBSTETRICAL HISTORY:  G2, P2 with two vaginal deliveries.   GYN HISTORY:  Menarche at age 19.  Cycles monthly.  No history of abnormal  Pap smear.  Last Pap was May of 2007.   FAMILY HISTORY:  Diabetes, heart disease and bone cancer.   SOCIAL HISTORY:  No tobacco, alcohol or drug use.   REVIEW OF SYSTEMS:  A 14-point review of systems reviewed.  Please see GYN  history in the chart but it is basically positive only for vaginal odor and  itching.   PHYSICAL EXAMINATION:  Her vitals are as noted in the chart.  She is a well-  developed, well-nourished white female in no acute distress.  Her abdomen is  soft, nontender, and nondistended.  GU normal external female.  The patient  has a pale, pink, slightly raised erythematous rash on the right labia  majora.  It is well confined.  There are no satellite lesions.  BUS is  normal.  The vagina is pink and rugous.  Uterus is small and anteverted.  Adnexa without mass or tenderness.  Extremities no clubbing, cyanosis, or  edema.   Scraping is done with heat fixation that reveals no significant yeast seen.   IMPRESSION:  Probable yeast vulvar candidiasis.  Treat this with definitely  some ketoconazole.   PLAN:  1.  Ketoconazole 200 mg tablet x5 days and accompanied by Terazol cream.  2.  After  this, we will complete a two-week course of every other day      Diflucan and follow up in three weeks.  If her symptoms have failed to      resolve by then, we will do a punch biopsy just to confirm diagnosis.   Thank you for referring this patient to me.           ______________________________  Tinnie Gens, MD     TP/MEDQ  D:  04/30/2006  T:  05/01/2006  Job:  [id number]

## 2011-05-10 ENCOUNTER — Other Ambulatory Visit: Payer: Self-pay | Admitting: *Deleted

## 2011-05-10 ENCOUNTER — Other Ambulatory Visit: Payer: Self-pay | Admitting: Family Medicine

## 2011-05-10 DIAGNOSIS — Z1231 Encounter for screening mammogram for malignant neoplasm of breast: Secondary | ICD-10-CM

## 2011-05-11 MED ORDER — CLOBETASOL PROPIONATE 0.05 % EX OINT: TOPICAL_OINTMENT | Freq: Two times a day (BID) | CUTANEOUS | Status: AC

## 2011-05-14 NOTE — Telephone Encounter (Signed)
Left message on machine at home for patient to call back with the name of the pharmacy she uses.

## 2011-05-14 NOTE — Telephone Encounter (Signed)
Rx called to [facility] per patients request.

## 2011-05-22 ENCOUNTER — Other Ambulatory Visit: Payer: Self-pay | Admitting: Family Medicine

## 2011-05-22 DIAGNOSIS — Z0000 Encounter for general adult medical examination without abnormal findings: Secondary | ICD-10-CM

## 2011-05-22 DIAGNOSIS — Z136 Encounter for screening for cardiovascular disorders: Secondary | ICD-10-CM

## 2011-05-22 DIAGNOSIS — E049 Nontoxic goiter, unspecified: Secondary | ICD-10-CM

## 2011-05-25 ENCOUNTER — Encounter: Payer: Self-pay | Admitting: Family Medicine

## 2011-05-25 ENCOUNTER — Other Ambulatory Visit (INDEPENDENT_AMBULATORY_CARE_PROVIDER_SITE_OTHER): Payer: BC Managed Care – PPO | Admitting: Family Medicine

## 2011-05-25 DIAGNOSIS — Z0000 Encounter for general adult medical examination without abnormal findings: Secondary | ICD-10-CM

## 2011-05-25 DIAGNOSIS — Z1231 Encounter for screening mammogram for malignant neoplasm of breast: Secondary | ICD-10-CM

## 2011-05-25 DIAGNOSIS — Z136 Encounter for screening for cardiovascular disorders: Secondary | ICD-10-CM

## 2011-05-25 DIAGNOSIS — E049 Nontoxic goiter, unspecified: Secondary | ICD-10-CM

## 2011-05-25 LAB — BASIC METABOLIC PANEL
CO2: 25 mEq/L (ref 19–32)
Calcium: 9.3 mg/dL (ref 8.4–10.5)
Chloride: 108 mEq/L (ref 96–112)
GFR: 81.58 mL/min (ref >60.00)
Glucose, Bld: 95 mg/dL (ref 70–99)
Potassium: 4.4 mEq/L (ref 3.5–5.1)
Sodium: 141 mEq/L (ref 135–145)

## 2011-05-25 LAB — LIPID PANEL
Total CHOL/HDL Ratio: 4
Triglycerides: 139 mg/dL (ref 0.0–149.0)
VLDL: 27.8 mg/dL (ref 0.0–40.0)

## 2011-05-25 LAB — LDL CHOLESTEROL, DIRECT: Direct LDL: 137.3 mg/dL

## 2011-05-25 LAB — TSH: TSH: 1.1 u[IU]/mL (ref 0.35–5.50)

## 2011-05-29 ENCOUNTER — Ambulatory Visit (INDEPENDENT_AMBULATORY_CARE_PROVIDER_SITE_OTHER): Payer: BC Managed Care – PPO | Admitting: Family Medicine

## 2011-05-29 ENCOUNTER — Encounter: Payer: Self-pay | Admitting: Family Medicine

## 2011-05-29 VITALS — BP 140/90 | HR 68 | Temp 98.2°F | Ht 67.0 in | Wt 178.8 lb

## 2011-05-29 DIAGNOSIS — Z0000 Encounter for general adult medical examination without abnormal findings: Secondary | ICD-10-CM

## 2011-05-29 MED ORDER — NORETHINDRONE-ETH ESTRADIOL 1-35 MG-MCG PO TABS: 1.0000 | ORAL_TABLET | Freq: Every day | ORAL | Status: DC

## 2011-05-29 NOTE — Progress Notes (Signed)
40 yo here for CPX.  Well woman- no h/o abnormal pap smears. 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 Last pap smear 05/2010. No family h/o cervical, uterine or breast CA.  Mammogram scheduled for next week.    Up to date on all other prevetion.  Wants to try another OCP, having more break through bleeding.  Patient Active Problem List  Diagnoses  . STREPTOCOCCAL PHARYNGITIS  . ONYCHOMYCOSIS  . GOITER  . ALLERGIC RHINITIS  . UTI  . VULVITIS  . VAGINAL DISCHARGE  . CELLULITIS/ABSCESS, LEG  . SWELLING MASS OR LUMP IN HEAD AND NECK  . DYSURIA   Past Medical History  Diagnosis Date  . Allergy    Past Surgical History  Procedure Date  . 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 Tongue biopsy 02/22/2009    [facility] surgeon    Family History  Problem Relation Age of Onset  . Diabetes Brother   . Obesity Brother    Allergies not on file Current Outpatient Prescriptions on File Prior to Visit  Medication Sig Dispense Refill  . clobetasol (TEMOVATE) 0.05 % ointment Apply topically 2 (two) times daily.  30 g  0  . levonorgestrel-ethinyl estradiol (SEASONALE) 0.15-0.03 MG per tablet Take 1 tablet by mouth daily.        . naproxen sodium (ANAPROX) 220 MG tablet OTC as directed           Review of Systems       See HPI General:  Denies chills and fever. Eyes:  Denies blurring. ENT:  Denies difficulty swallowing. CV:  Denies chest pain or discomfort. Resp:  Denies shortness of breath. GI:  Denies abdominal pain, bloody stools, and change in bowel habits. GU:  Complains of discharge; denies abnormal vaginal bleeding and dysuria. MS:  Denies joint pain, joint redness, and joint swelling. Derm:  Denies rash. Neuro:  Denies headaches. Psych:  Denies anxiety and depression. Endo:  Denies cold intolerance and heat intolerance.  Physical Exam BP 140/90  Pulse 68  Temp(Src) 98.2 F (36.8 C) (Oral)  Ht 5\' 7"  (1.702 m)  Wt 178 lb 12 oz (81.08 kg)  BMI 28.00 kg/m2  LMP  05/02/2011  General:  alert, well-developed, well-nourished, and well-hydrated.   Head:  normocephalic, atraumatic, and no abnormalities observed.   Eyes:  vision grossly intact, pupils equal, pupils round, and pupils reactive to light.   Ears:  R ear normal and L ear normal.   Nose:  no external deformity.   Mouth:  good dentition.   Lungs:  Normal respiratory effort, chest expands symmetrically. Lungs are clear to auscultation, no crackles or wheezes. Heart:  Normal rate and regular rhythm. S1 and S2 normal without gallop, murmur, click, rub or other extra sounds. Abdomen:  Bowel sounds positive,abdomen soft and non-tender without masses, organomegaly or hernias noted. Msk:  No deformity or scoliosis noted of thoracic or lumbar spine.   Extremities:  No clubbing, cyanosis, edema, or deformity noted with normal full range of motion of all joints.   Skin:  Intact without suspicious lesions or rashes Psych:  normally interactive and good eye contact.    1. Routine general medical examination at a health care facility    Reviewed preventive care protocols, scheduled due services, and updated immunizations Discussed nutrition, exercise, diet, and healthy lifestyle.

## 2011-06-04 ENCOUNTER — Ambulatory Visit
Admission: RE | Admit: 2011-06-04 | Discharge: 2011-06-04 | Disposition: A | Discharge status: Home / Self Care | Payer: BC Managed Care – PPO | Source: Ambulatory Visit | Attending: Family Medicine | Admitting: Family Medicine

## 2011-06-05 ENCOUNTER — Encounter: Payer: Self-pay | Admitting: *Deleted

## 2011-07-05 ENCOUNTER — Other Ambulatory Visit: Payer: Self-pay | Admitting: *Deleted

## 2011-07-05 MED ORDER — NORETHINDRONE-ETH ESTRADIOL 1-35 MG-MCG PO TABS: 1.0000 | ORAL_TABLET | Freq: Every day | ORAL | Status: DC

## 2011-11-05 ENCOUNTER — Ambulatory Visit (INDEPENDENT_AMBULATORY_CARE_PROVIDER_SITE_OTHER): Payer: BC Managed Care – PPO | Admitting: Family Medicine

## 2011-11-05 ENCOUNTER — Encounter: Payer: Self-pay | Admitting: Family Medicine

## 2011-11-05 VITALS — BP 152/90 | HR 84 | Temp 98.2°F | Wt 188.8 lb

## 2011-11-05 DIAGNOSIS — R309 Painful micturition, unspecified: Secondary | ICD-10-CM

## 2011-11-05 DIAGNOSIS — N390 Urinary tract infection, site not specified: Secondary | ICD-10-CM

## 2011-11-05 DIAGNOSIS — R300 Dysuria: Secondary | ICD-10-CM

## 2011-11-05 LAB — POCT URINALYSIS DIPSTICK
Bilirubin, UA: UNDETERMINED
Glucose, UA: NEGATIVE
Spec Grav, UA: 1.02
pH, UA: 7

## 2011-11-05 MED ORDER — CIPROFLOXACIN HCL 500 MG PO TABS: 500.0000 mg | ORAL_TABLET | Freq: Two times a day (BID) | ORAL | Status: AC

## 2011-11-05 NOTE — Progress Notes (Signed)
  Subjective:    Patient ID: [name], female    DOB: [birth year], 40 y.o.   MRN: [id number]  HPI CC: UTI?  Today started with suprapubic pressure as well as dysuria.  Has started azo today.  No urgency.  No blood in urine.  No flank pain, feers/chills, nausea/vomiting.  Mother in hospital with burst appendix.  Last UTI was 3 mo ago.  Tends to get UTIs frequently.  That is when used antibiotics last.  Review of Systems Per HPI    Objective:   Physical Exam  Nursing note and vitals reviewed. Constitutional: She appears well-developed and well-nourished. No distress.  Abdominal: Soft. Bowel sounds are normal. She exhibits no distension and no mass. There is no tenderness. There is no rebound and no guarding.       No cva tenderness  Musculoskeletal: She exhibits no edema.  Skin: Skin is warm and dry.       Assessment & Plan:

## 2011-11-05 NOTE — Patient Instructions (Signed)
Urine wasn't a good specimen. I'd like [place] to recollect and will send culture. Treat for now with antibiotic - cipro twice daily for 5 days. Call us if not improved after antibiotic or any worsening. Push fluids and rest.

## 2011-11-05 NOTE — Assessment & Plan Note (Signed)
Concern for infection - initial collection contaminated - have recollected to send culture. Given sxs, treat as UTI with 5d course cipro. Update [place] if not improved after this.

## 2011-11-05 NOTE — Progress Notes (Signed)
Rx called in as directed due to e-scribe error. 

## 2011-11-07 LAB — URINE CULTURE

## 2011-11-08 ENCOUNTER — Telehealth: Payer: Self-pay | Admitting: Family Medicine

## 2011-11-08 MED ORDER — AMOXICILLIN 500 MG PO CAPS: 500.0000 mg | ORAL_CAPSULE | Freq: Three times a day (TID) | ORAL | Status: AC

## 2011-11-08 NOTE — Telephone Encounter (Signed)
Message left notifying patient of new Rx, to finish Cipro as well and to use 2nd form of BC while taking both. Instructed to call with any questions or concerns.

## 2011-11-08 NOTE — Telephone Encounter (Signed)
Please notify i'd like to add on amoxicillin twice daily for 3 days given bacteria that grew out of urine (finish cipro as well) To use 2nd form birth control while on abx

## 2012-05-20 ENCOUNTER — Other Ambulatory Visit: Payer: Self-pay | Admitting: Family Medicine

## 2012-05-20 DIAGNOSIS — Z0000 Encounter for general adult medical examination without abnormal findings: Secondary | ICD-10-CM

## 2012-05-20 DIAGNOSIS — Z1231 Encounter for screening mammogram for malignant neoplasm of breast: Secondary | ICD-10-CM

## 2012-05-20 DIAGNOSIS — Z136 Encounter for screening for cardiovascular disorders: Secondary | ICD-10-CM

## 2012-05-26 ENCOUNTER — Other Ambulatory Visit (INDEPENDENT_AMBULATORY_CARE_PROVIDER_SITE_OTHER): Payer: BC Managed Care – PPO

## 2012-05-26 DIAGNOSIS — Z136 Encounter for screening for cardiovascular disorders: Secondary | ICD-10-CM

## 2012-05-26 DIAGNOSIS — Z0000 Encounter for general adult medical examination without abnormal findings: Secondary | ICD-10-CM

## 2012-05-26 LAB — COMPREHENSIVE METABOLIC PANEL
AST: 24 U/L (ref 0–37)
BUN: 13 mg/dL (ref 6–23)
Calcium: 8.7 mg/dL (ref 8.4–10.5)
Chloride: 107 mEq/L (ref 96–112)
Creatinine, Ser: 0.9 mg/dL (ref 0.4–1.2)
GFR: 70.2 mL/min (ref >60.00)
Total Bilirubin: 0.3 mg/dL (ref 0.3–1.2)

## 2012-05-26 LAB — LIPID PANEL
HDL: 53.2 mg/dL (ref >39.00)
Total CHOL/HDL Ratio: 3
Triglycerides: 138 mg/dL (ref 0.0–149.0)
VLDL: 27.6 mg/dL (ref 0.0–40.0)

## 2012-05-29 ENCOUNTER — Ambulatory Visit (INDEPENDENT_AMBULATORY_CARE_PROVIDER_SITE_OTHER): Payer: BC Managed Care – PPO | Admitting: Family Medicine

## 2012-05-29 ENCOUNTER — Encounter: Payer: Self-pay | Admitting: Family Medicine

## 2012-05-29 ENCOUNTER — Other Ambulatory Visit (HOSPITAL_COMMUNITY)
Admission: RE | Admit: 2012-05-29 | Discharge: 2012-05-29 | Disposition: A | Discharge status: Home / Self Care | Payer: BC Managed Care – PPO | Source: Ambulatory Visit | Attending: Family Medicine | Admitting: Family Medicine

## 2012-05-29 VITALS — BP 150/96 | HR 88 | Temp 97.7°F | Ht 66.5 in | Wt 184.0 lb

## 2012-05-29 DIAGNOSIS — Z6825 Body mass index (BMI) 25.0-25.9, adult: Secondary | ICD-10-CM

## 2012-05-29 DIAGNOSIS — Z0000 Encounter for general adult medical examination without abnormal findings: Secondary | ICD-10-CM

## 2012-05-29 DIAGNOSIS — E663 Overweight: Secondary | ICD-10-CM

## 2012-05-29 DIAGNOSIS — Z01419 Encounter for gynecological examination (general) (routine) without abnormal findings: Secondary | ICD-10-CM | POA: Insufficient documentation

## 2012-05-29 NOTE — Addendum Note (Signed)
Addended by: Eliezer Bottom on: 05/29/2012 09:13 AM   Modules accepted: Orders

## 2012-05-29 NOTE — Progress Notes (Signed)
41 yo here for CPX.  Well woman- no h/o abnormal pap smears. 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 Last pap smear 05/2010. No family h/o cervical, uterine or breast CA.  Mammogram scheduled for next week.    Up to date on all other prevetion.  Lipid panel much improved!  She has cut back on fried foods but continues to gain weight.  Admits to not exercising or cutting back on her portions. Wt Readings from Last 3 Encounters:  05/29/12 184 lb (83.462 kg)  11/05/11 188 lb 12 oz (85.616 kg)  05/29/11 178 lb 12 oz (81.08 kg)     Lab Results  Component Value Date   CHOL 146 05/26/2012   HDL 53.20 05/26/2012   LDLCALC 65 05/26/2012   LDLDIRECT 137.3 05/25/2011   TRIG 138.0 05/26/2012   CHOLHDL 3 05/26/2012     Patient Active Problem List  Diagnosis  . STREPTOCOCCAL PHARYNGITIS  . ONYCHOMYCOSIS  . GOITER  . ALLERGIC RHINITIS  . UTI  . VULVITIS  . VAGINAL DISCHARGE  . CELLULITIS/ABSCESS, LEG  . SWELLING MASS OR LUMP IN HEAD AND NECK  . DYSURIA  . Routine general medical examination at a health care facility   Past Medical History  Diagnosis Date  . Allergy    Past Surgical History  Procedure Date  . 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 Tongue biopsy 02/22/2009    [facility] surgeon    Family History  Problem Relation Age of Onset  . Diabetes Brother   . Obesity Brother    No Known Allergies Current Outpatient Prescriptions on File Prior to Visit  Medication Sig Dispense Refill  . naproxen sodium (ANAPROX) 220 MG tablet OTC as directed       . norethindrone-ethinyl estradiol 1/35 (ORTHO-NOVUM, NORTREL,CYCLAFEM) tablet Take 1 tablet by mouth daily.  28 tablet  12      Review of Systems       See HPI Patient reports no  vision/ hearing changes,anorexia, weight change, fever ,adenopathy, persistant / recurrent hoarseness, swallowing issues, chest pain, edema,persistant / recurrent cough, hemoptysis, dyspnea(rest, exertional, paroxysmal nocturnal), gastrointestinal   bleeding (melena, rectal bleeding), abdominal pain, excessive heart burn, GU symptoms(dysuria, hematuria, pyuria, voiding/incontinence  Issues) syncope, focal weakness, severe memory loss, concerning skin lesions, depression, anxiety, abnormal bruising/bleeding, major joint swelling, breast masses or abnormal vaginal bleeding.     Physical Exam BP 150/96  Pulse 88  Temp 97.7 F (36.5 C)  Ht 5' 6.5" (1.689 m)  Wt 184 lb (83.462 kg)  BMI 29.25 kg/m2  LMP 05/10/2012  General:  Well-developed,well-nourished,in no acute distress; alert,appropriate and cooperative throughout examination Head:  normocephalic and atraumatic.   Eyes:  vision grossly intact, pupils equal, pupils round, and pupils reactive to light.   Ears:  R ear normal and L ear normal.   Nose:  no external deformity.   Mouth:  good dentition.   Neck:  No deformities, masses, or tenderness noted. Breasts:  No mass, nodules, thickening, tenderness, bulging, retraction, inflamation, nipple discharge or skin changes noted.   Lungs:  Normal respiratory effort, chest expands symmetrically. Lungs are clear to auscultation, no crackles or wheezes. Heart:  Normal rate and regular rhythm. S1 and S2 normal without gallop, murmur, click, rub or other extra sounds. Abdomen:  Bowel sounds positive,abdomen soft and non-tender without masses, organomegaly or hernias noted. Rectal:  no external abnormalities.   Genitalia:  Pelvic Exam:        External: normal female genitalia without lesions or masses  Vagina: normal without lesions or masses        Cervix: normal without lesions or masses        Adnexa: normal bimanual exam without masses or fullness        Uterus: normal by palpation        Pap smear: performed Msk:  No deformity or scoliosis noted of thoracic or lumbar spine.   Extremities:  No clubbing, cyanosis, edema, or deformity noted with normal full range of motion of all joints.   Neurologic:  alert & oriented X3 and gait  normal.   Skin:  Intact without suspicious lesions or rashes Cervical Nodes:  No lymphadenopathy noted Axillary Nodes:  No palpable lymphadenopathy Psych:  Cognition and judgment appear intact. Alert and cooperative with normal attention span and concentration. No apparent delusions, illusions, hallucinations    1. Routine general medical examination at a health care facility    Reviewed preventive care protocols, scheduled due services, and updated immunizations Discussed nutrition, exercise, diet, and healthy lifestyle.  2. Overweight (BMI 25.0-29.9)    Discussed increased exercise and portion control. See pt instructions.

## 2012-05-29 NOTE — Patient Instructions (Addendum)
Let's stop your birth control pill. Try My Fitness Pal to see if that helps with your portion control. Have a wonderful time at the beach!

## 2012-06-02 MED ORDER — FLUCONAZOLE 150 MG PO TABS: 150.0000 mg | ORAL_TABLET | Freq: Once | ORAL | Status: DC

## 2012-06-02 MED ORDER — FLUCONAZOLE 150 MG PO TABS: 150.0000 mg | ORAL_TABLET | Freq: Once | ORAL | Status: AC

## 2012-06-02 NOTE — Addendum Note (Signed)
Addended by: Eliezer Bottom on: 06/02/2012 02:57 PM   Modules accepted: Orders

## 2012-06-02 NOTE — Addendum Note (Signed)
Addended by: Dianne Dun on: 06/02/2012 04:50 PM   Modules accepted: Orders

## 2012-06-03 ENCOUNTER — Ambulatory Visit
Admission: RE | Admit: 2012-06-03 | Discharge: 2012-06-03 | Disposition: A | Discharge status: Home / Self Care | Payer: BC Managed Care – PPO | Source: Ambulatory Visit | Attending: Family Medicine | Admitting: Family Medicine

## 2012-06-03 DIAGNOSIS — Z1231 Encounter for screening mammogram for malignant neoplasm of breast: Secondary | ICD-10-CM

## 2012-06-05 ENCOUNTER — Encounter: Payer: Self-pay | Admitting: Family Medicine

## 2012-06-05 ENCOUNTER — Encounter: Payer: Self-pay | Admitting: *Deleted

## 2012-07-14 ENCOUNTER — Telehealth: Payer: Self-pay

## 2012-07-14 NOTE — Telephone Encounter (Signed)
Pt said portion control and My fitness pal is not working for pt's weight loss; pt said at 05/29/12 visit discussed possible diet pill.If not too expensive pt would like to try diet pill sent to [facility].Please advise.

## 2012-07-14 NOTE — Telephone Encounter (Signed)
Unfortunately she would need to come in to discuss diet pills. We have to document her BP and pulse at time that we prescribe it and that we discussed risks and benefits.

## 2012-07-14 NOTE — Telephone Encounter (Signed)
Advised patient, she want to hold off on appt for now.  Will call back if she changes her mind.

## 2012-08-14 ENCOUNTER — Telehealth: Payer: Self-pay | Admitting: Family Medicine

## 2012-08-14 NOTE — Telephone Encounter (Signed)
Caller: [place]/Patient; Patient Name: [name]; PCP: Ruthe Mannan [facility]); Best Callback Phone Number: [phone].  Pt. notes a lump on her L. neck 7 days ago after a sore throat.  Throat is no longer sore but the lump persists.  Triaged with Neck Lump or Swelling and all emergent symptoms ruled out per guidelines.  Home care instructions given, including instructing pt. to call back if lump persists after 2 weeks of onset.  CAN/db

## 2012-08-19 ENCOUNTER — Encounter: Payer: Self-pay | Admitting: Family Medicine

## 2012-08-19 ENCOUNTER — Ambulatory Visit (INDEPENDENT_AMBULATORY_CARE_PROVIDER_SITE_OTHER): Payer: BC Managed Care – PPO | Admitting: Family Medicine

## 2012-08-19 ENCOUNTER — Telehealth: Payer: Self-pay | Admitting: Family Medicine

## 2012-08-19 VITALS — BP 142/84 | HR 102 | Temp 97.9°F | Wt 184.2 lb

## 2012-08-19 DIAGNOSIS — E049 Nontoxic goiter, unspecified: Secondary | ICD-10-CM

## 2012-08-19 NOTE — Progress Notes (Signed)
  Subjective:    Patient ID: [name], female    DOB: 1971-02-22, 41 y.o.   MRN: [id number]  HPI CC: L neck swelling  h/o goiter s/p normal thyroid biopsy remotely.  Noted for last 1.5 wks sudden left sided swelling at neck.  No pain, fevers/chills, viral illnesses, coughing, hoarseness, dysphagia.  Denies heat/cold intolerance, n/v/d/c, skin or hair changes, weight changes.  No childhood radiation.  Uses iodinated salt.  No fmhx thyroid problems in past. No smokers at home.  H/o white coat hypertension, not truly HTNive at home.  Medications and allergies reviewed and updated in chart.  Past histories reviewed and updated if relevant as below. Patient Active Problem List  Diagnosis  . STREPTOCOCCAL PHARYNGITIS  . ONYCHOMYCOSIS  . GOITER  . ALLERGIC RHINITIS  . UTI  . VULVITIS  . VAGINAL DISCHARGE  . CELLULITIS/ABSCESS, LEG  . SWELLING MASS OR LUMP IN HEAD AND NECK  . DYSURIA  . Routine general medical examination at a health care facility  . Overweight (BMI 25.0-29.9)   Past Medical History  Diagnosis Date  . Allergy    Past Surgical History  Procedure Date  . 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 Tongue biopsy 02/22/2009    [facility] surgeon   History  Substance Use Topics  . Smoking status: Never Smoker   . Smokeless tobacco: Not on file  . Alcohol Use: Not on file   Family History  Problem Relation Age of Onset  . Diabetes Brother   . Obesity Brother    No Known Allergies Current Outpatient Prescriptions on File Prior to Visit  Medication Sig Dispense Refill  . naproxen sodium (ANAPROX) 220 MG tablet OTC as directed          Review of Systems Per HPI    Objective:   Physical Exam  Nursing note and vitals reviewed. Constitutional: She appears well-developed and well-nourished. No distress.  HENT:  Head: Normocephalic and atraumatic.  Mouth/Throat: Oropharynx is clear and moist. No oropharyngeal exudate.  Eyes: Conjunctivae and EOM are normal. Pupils are  equal, round, and reactive to light.  Neck: Normal range of motion. Neck supple. Carotid bruit is not present. No tracheal deviation present. Thyromegaly (uniformly enlarged thyroid L>R, evident goiter present, about 3cm swelling) present.  Cardiovascular: Normal rate, regular rhythm, normal heart sounds and intact distal pulses.   No murmur heard. Pulmonary/Chest: Effort normal and breath sounds normal. No respiratory distress. She has no wheezes. She has no rales.  Musculoskeletal: She exhibits no edema.  Lymphadenopathy:    She has no cervical adenopathy.  Skin: Skin is warm and dry. No rash noted.  Psychiatric: She has a normal mood and affect.       Assessment & Plan:

## 2012-08-19 NOTE — Telephone Encounter (Signed)
Caller: [facility]/Patient; Patient Name: [name]; PCP: Ruthe Mannan [facility]); Best Callback Phone Number: [phone]; Reason for call:  lump on R side of neck.  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  Lump is nontender; approx quarter-sized.  States that the lump is not where a swollen gland would be, although she had a sore throat initially, but states it is in the center of her neck to the right of the trachea.  Per neck lump protocol, emergent symptoms denied; advised appointment per nursing judgment/patient anxiety around this; appointment with Dr. Renee Ramus 08/19/12 1600.

## 2012-08-19 NOTE — Patient Instructions (Signed)
Pass by Marion's office to schedule thyroid ultrasound. Blood work today We will call you with results.  Goiter Goiter is an enlarged thyroid gland. The thyroid gland sits at the base of the front of the neck. The gland produces hormones that regulate mood, body temperature, pulse rate, and digestion. Most goiters are painless and are not a cause for serious concern. Goiters and conditions that cause goiters can be treated if necessary.  CAUSES  Common causes of goiter include:  Graves disease (causes too much hormone to be produced [hyperthyroidism]).   Hashimoto's disease (causes too little hormone to be produced [hypothyroidism]).   Thyroiditis (inflammation of the thyroid sometimes caused by virus or pregnancy).   Nodular goiter (small bumps form; sometimes called toxic nodular goiter).   Pregnancy.   Thyroid cancer (very few goiters with nodules are cancerous).   Certain medications.   Radiation exposure.   Iodine deficiency (more common in developing countries in inland populations).  RISK FACTORS Risk factors for goiter include:  A family history of goiter.   Female gender.   Inadequate iodine in the diet.   Age older than 40 years.  SYMPTOMS  Many goiters do not cause symptoms. When symptoms do occur, they may include:  Swelling in the lower part of the neck. This swelling can range from a very small bump to a large lump.   A tight feeling in the throat.   A hoarse voice.  Less commonly, a goiter may result in:  Coughing.   Wheezing.   Difficulty swallowing.   Difficulty breathing.   Bulging neck veins.   Dizziness.  When a goiter is the result of hyperthyroidism, symptoms may include:  Rapid or irregular heart beat.   Sicknessin your stomach (nausea).   Vomiting.   Diarrhea.   Shaking.   Irritable feeling.   Bulging eyes.   Weight loss.   Heat sensitivity.   Anxiety.  When a goiter is the result of hypothyroidism, symptoms may  include:  Tiredness.   Dry skin.   Constipation.   Weight gain.   Irregular menstrual cycle.   Depressed mood.   Sensitivity to cold.  DIAGNOSIS  Tests used to diagnose goiter include:  A physical exam.   Blood tests, including thyroid hormone levels and antibody testing.   Ultrasonography, computerized X-ray scan (computed tomography, CT) or computerized magnetic scan (magnetic resonance imaging, MRI).   Thyroid scan (imaging along with safe radioactive injection).   Tissue sample taken (biopsy) of nodules. This is sometimes done to confirm that the nodules are not cancerous.  TREATMENT  Treatment will depend on the cause of the goiter. Treatment may include:  Monitoring. In some cases, no treatment is necessary, and your doctor will monitor yourcondition at regular check ups.   Medications and supplements. Thyroid medication (thyroid hormone replacement) is available for hyperthroidism and hypothyroidism.   If inflammation is the cause, over-the-counter medication or steroid medication may be recommended.   Goiters caused by iodine deficiency can be treated with iodine supplements or changes in diet.   Radioactive iodine treatment. Radioactive iodine is injected into the blood. It travels to the thyroid gland, kills thyroid cells, and reduces the size of the gland. This is only used when the thyroid gland is overactive. Lifelong thyroid hormone medication is often necessary after this treatment.   Surgery. A procedure to remove all or part of the gland may be recommended in severe cases or when cancer is the cause. Hormones can be taken to replace  the hormones normally produced by the thyroid.  HOME CARE INSTRUCTIONS   Take medications as directed.   Follow your caregiver's recommendations for any dietary changes.   Follow up with your caregiver for further examination and testing, as directed.  PREVENTION   If you have a family history of goiter, discuss screening  with your doctor.   Make sure you are getting enough iodine in your diet.   Use of iodized table salt can help prevent iodine deficiency.  Document Released: 05/16/2010 Document Revised: 11/15/2011 Document Reviewed: 05/16/2010 [facility] Patient Information 2012 [place], [place].

## 2012-08-19 NOTE — Telephone Encounter (Signed)
Will see then. 

## 2012-08-19 NOTE — Assessment & Plan Note (Addendum)
Acutely enlarge goiter bilateral lobes, L>R.  Anticipate diffuse nontoxic goiter. Start with blood work - TSH, free T4, TPO and TSI.  Will also obtain thyroid ultrasound. Will call pt with results.

## 2012-08-21 ENCOUNTER — Ambulatory Visit
Admission: RE | Admit: 2012-08-21 | Discharge: 2012-08-21 | Disposition: A | Discharge status: Home / Self Care | Payer: BC Managed Care – PPO | Source: Ambulatory Visit | Attending: Family Medicine | Admitting: Family Medicine

## 2012-08-21 DIAGNOSIS — E049 Nontoxic goiter, unspecified: Secondary | ICD-10-CM

## 2012-08-22 ENCOUNTER — Encounter: Payer: Self-pay | Admitting: Family Medicine

## 2012-08-22 ENCOUNTER — Other Ambulatory Visit: Payer: Self-pay | Admitting: Family Medicine

## 2012-08-22 DIAGNOSIS — E049 Nontoxic goiter, unspecified: Secondary | ICD-10-CM

## 2012-08-22 LAB — THYROID STIMULATING IMMUNOGLOBULIN: TSI: 55 % baseline (ref <140)

## 2012-09-04 ENCOUNTER — Ambulatory Visit (INDEPENDENT_AMBULATORY_CARE_PROVIDER_SITE_OTHER): Payer: BC Managed Care – PPO | Admitting: General Surgery

## 2012-09-04 ENCOUNTER — Encounter (INDEPENDENT_AMBULATORY_CARE_PROVIDER_SITE_OTHER): Payer: Self-pay | Admitting: General Surgery

## 2012-09-04 VITALS — BP 134/76 | HR 84 | Temp 98.0°F | Resp 20 | Ht 67.0 in | Wt 181.2 lb

## 2012-09-04 DIAGNOSIS — E042 Nontoxic multinodular goiter: Secondary | ICD-10-CM

## 2012-09-04 NOTE — Progress Notes (Signed)
Patient ID: [name], female   DOB: 1971/12/08, 41 y.o.   MRN: [id number]  Chief Complaint  Patient presents with  . Pre-op Exam    eval acute goiter    HPI [name] is a 41 y.o. female.  This patient is referred by Dr. Sharen Hones for evaluation of a nontoxic multinodular goiter with dominant nodule in the left upper pole measuring 4.1 cm. She says that she has no family history of any endocrine problems such as pancreatic problems, thyroid problems, brain tumors. She has no other significant family history at all. She is otherwise healthy and has no other personal medical history as well. She says that she has no symptoms from this and denies any dysphasia, voice changes, tenderness, cough palpitations, weight loss or weight gain, hair changes or skin changes. She denies any other problems and has found this incidentally while rubbing her neck. HPI  Past Medical History  Diagnosis Date  . Allergy     Past Surgical History  Procedure Date  . 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 Tongue biopsy 02/22/2009    [facility]    Family History  Problem Relation Age of Onset  . Diabetes Brother   . Obesity Brother   . Cancer Paternal Grandfather     bone    Social History History  Substance Use Topics  . Smoking status: Never Smoker   . Smokeless tobacco: Never Used  . Alcohol Use: No    No Known Allergies  Current Outpatient Prescriptions  Medication Sig Dispense Refill  . naproxen sodium (ANAPROX) 220 MG tablet OTC as directed         Review of Systems Review of Systems All other review of systems negative or noncontributory except as stated in the HPI  Blood pressure 134/76, pulse 84, temperature 98 F (36.7 C), temperature source Temporal, resp. rate 20, height 5\' 7"  (1.702 m), weight 181 lb 3.2 oz (82.192 kg), last menstrual period 08/06/2012.  Physical Exam Physical Exam Physical Exam  Nursing note and vitals reviewed. Constitutional: She is oriented to person,  place, and time. She appears well-developed and well-nourished. No distress.  HENT:  Head: Normocephalic and atraumatic.  Mouth/Throat: No oropharyngeal exudate.  Eyes: Conjunctivae and EOM are normal. Pupils are equal, round, and reactive to light. Right eye exhibits no discharge. Left eye exhibits no discharge. No scleral icterus.  Neck: Normal range of motion. Neck supple. No tracheal deviation present. She has a palpable left thyroid nodule and and visible thyroid goiter.  No LAD Cardiovascular: Normal rate, regular rhythm, normal heart sounds and intact distal pulses.   Pulmonary/Chest: Effort normal and breath sounds normal. No stridor. No respiratory distress. She has no wheezes.  Abdominal: Soft. Bowel sounds are normal. She exhibits no distension and no mass. There is no tenderness. There is no rebound and no guarding.  Musculoskeletal: Normal range of motion. She exhibits no edema and no tenderness.  Neurological: She is alert and oriented to person, place, and time.  Skin: Skin is warm and dry. No rash noted. She is not diaphoretic. No erythema. No pallor.  Psychiatric: She has a normal mood and affect. Her behavior is normal. Judgment and thought content normal.    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 This left upper pole nodule which is actually a complex cystic  lesion has never been biopsied. In general, she has no symptoms which could be attributed to her thyroid however, given the fact that she has had this personal and has a complex nodule, I recommended biopsy at a minimum of the dominant nodule. Certainly if this comes back concerning for malignancy then I would recommend thyroidectomy. I also offered thyroidectomy as well for treatment of the multinodular goiter since this has been changing and headlight 2  previous biopsies. I did explain that there is a low likelihood of malignancy in multinodular goiter and this cannot be completely ruled out without pathologic confirmation. We also discussed the surgery and its risks including poor cosmesis, injury to recurrent laryngeal nerves and injury to parathyroid glands requiring calcium supplementation and the need for lifelong thyroid hormone supplementation. She relates to consider the options and she will call back when she the sites which options she would like to proceed with    Plan    She will call me back to decide if she is interested in biopsy of the lesion and followup ultrasound or if she elects to proceed with surgery.       Lodema Pilot DAVID 09/04/2012, 9:40 AM

## 2012-09-08 ENCOUNTER — Other Ambulatory Visit (INDEPENDENT_AMBULATORY_CARE_PROVIDER_SITE_OTHER): Payer: Self-pay | Admitting: General Surgery

## 2012-09-08 DIAGNOSIS — E041 Nontoxic single thyroid nodule: Secondary | ICD-10-CM

## 2012-09-25 ENCOUNTER — Ambulatory Visit
Admission: RE | Admit: 2012-09-25 | Discharge: 2012-09-25 | Disposition: A | Discharge status: Home / Self Care | Payer: BC Managed Care – PPO | Source: Ambulatory Visit | Attending: General Surgery | Admitting: General Surgery

## 2012-09-25 ENCOUNTER — Other Ambulatory Visit (HOSPITAL_COMMUNITY)
Admission: RE | Admit: 2012-09-25 | Discharge: 2012-09-25 | Disposition: A | Discharge status: Home / Self Care | Payer: BC Managed Care – PPO | Source: Ambulatory Visit | Attending: Interventional Radiology | Admitting: Interventional Radiology

## 2012-09-25 DIAGNOSIS — E041 Nontoxic single thyroid nodule: Secondary | ICD-10-CM

## 2012-09-25 DIAGNOSIS — E049 Nontoxic goiter, unspecified: Secondary | ICD-10-CM | POA: Insufficient documentation

## 2012-10-01 ENCOUNTER — Encounter (INDEPENDENT_AMBULATORY_CARE_PROVIDER_SITE_OTHER): Payer: Self-pay | Admitting: General Surgery

## 2012-10-01 ENCOUNTER — Ambulatory Visit (INDEPENDENT_AMBULATORY_CARE_PROVIDER_SITE_OTHER): Payer: BC Managed Care – PPO | Admitting: General Surgery

## 2012-10-01 VITALS — BP 118/80 | HR 91 | Temp 97.6°F | Ht 67.0 in | Wt 182.6 lb

## 2012-10-01 DIAGNOSIS — E042 Nontoxic multinodular goiter: Secondary | ICD-10-CM

## 2012-10-01 NOTE — Progress Notes (Signed)
Subjective:     Patient ID: [name], female   DOB: 09-11-1971, 41 y.o.   MRN: [id number]  HPI This patient follows up status post needle biopsy of her left thyroid dominant nodule. She remains asymptomatic it turns out that this was a primary cystic nodule and the lesion is no longer palpable to the patient. The pathology was benign and consistent with nonneoplastic goiter. She remains not interested in surgery  Review of Systems     Objective:   Physical Exam Distress nontoxic-appearing She has palpable nodularity of her thyroid bilaterally with some bruising in her left neck from the biopsy    Assessment:     Multinodular goiter with dominant nodule Biopsy of the dominant nodule was negative for malignancy. She has had several biopsies previously and is not interested in surgery despite the complex nature of this cyst and nodule. I explained that the fine needle aspiration of is not 100% and sampling near could be possible and malignancy could still be a possibility. This is most likely benign but we cannot completely exclude malignancy.    Plan:     She will follow up with self exams of her neck and annual exams and ultrasound with her primary care physician. If she  Becomes interested in surgery and would be happy to see her back for repeat evaluation.

## 2012-10-01 NOTE — Patient Instructions (Signed)
Follow up with annual ultrasound of your neck/thyroid

## 2013-05-06 ENCOUNTER — Other Ambulatory Visit: Payer: Self-pay

## 2013-05-06 DIAGNOSIS — Z1231 Encounter for screening mammogram for malignant neoplasm of breast: Secondary | ICD-10-CM

## 2013-05-25 ENCOUNTER — Other Ambulatory Visit (INDEPENDENT_AMBULATORY_CARE_PROVIDER_SITE_OTHER): Payer: BC Managed Care – PPO

## 2013-05-25 ENCOUNTER — Other Ambulatory Visit: Payer: Self-pay | Admitting: Family Medicine

## 2013-05-25 DIAGNOSIS — Z0000 Encounter for general adult medical examination without abnormal findings: Secondary | ICD-10-CM

## 2013-05-25 DIAGNOSIS — Z136 Encounter for screening for cardiovascular disorders: Secondary | ICD-10-CM

## 2013-05-25 LAB — COMPREHENSIVE METABOLIC PANEL
ALT: 27 U/L (ref 0–35)
AST: 21 U/L (ref 0–37)
Alkaline Phosphatase: 83 U/L (ref 39–117)
CO2: 25 mEq/L (ref 19–32)
Creatinine, Ser: 0.8 mg/dL (ref 0.4–1.2)
GFR: 78.58 mL/min (ref >60.00)
Total Bilirubin: 0.6 mg/dL (ref 0.3–1.2)

## 2013-05-25 LAB — LIPID PANEL
HDL: 46.9 mg/dL (ref >39.00)
VLDL: 19.6 mg/dL (ref 0.0–40.0)

## 2013-06-01 ENCOUNTER — Encounter: Payer: Self-pay | Admitting: Family Medicine

## 2013-06-01 ENCOUNTER — Ambulatory Visit (INDEPENDENT_AMBULATORY_CARE_PROVIDER_SITE_OTHER): Payer: BC Managed Care – PPO | Admitting: Family Medicine

## 2013-06-01 VITALS — BP 132/84 | HR 84 | Temp 98.0°F | Ht 66.5 in | Wt 186.0 lb

## 2013-06-01 DIAGNOSIS — L989 Disorder of the skin and subcutaneous tissue, unspecified: Secondary | ICD-10-CM

## 2013-06-01 DIAGNOSIS — E663 Overweight: Secondary | ICD-10-CM

## 2013-06-01 DIAGNOSIS — B351 Tinea unguium: Secondary | ICD-10-CM

## 2013-06-01 DIAGNOSIS — Z6825 Body mass index (BMI) 25.0-25.9, adult: Secondary | ICD-10-CM

## 2013-06-01 DIAGNOSIS — Z0000 Encounter for general adult medical examination without abnormal findings: Secondary | ICD-10-CM

## 2013-06-01 MED ORDER — CLOBETASOL PROPIONATE 0.05 % EX CREA: TOPICAL_CREAM | Freq: Two times a day (BID) | CUTANEOUS | Status: DC

## 2013-06-01 MED ORDER — TERBINAFINE HCL 250 MG PO TABS: 250.0000 mg | ORAL_TABLET | Freq: Every day | ORAL | Status: DC

## 2013-06-01 NOTE — Patient Instructions (Addendum)
Good to see you. We are starting Lamisil 250 mg daily x 12 weeks.  Please come back in 6 weeks for lab work.  Try the clobetasol on your skin lesion.  Keep me posted.  Please stop by to see Shirlee Limerick on your way out to set up your referral to nutritionist.

## 2013-06-01 NOTE — Progress Notes (Signed)
42 yo pleasant female here for CPX.  Well woman- no h/o abnormal pap smears. 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 Last pap smear 05/2012. No family h/o cervical, uterine or breast CA.  Mammogram scheduled for this Friday.  Up to date on all other prevetion.  Lipid panel much improved!  She has cut back on fried foods but continues to gain weight.   Lab Results  Component Value Date   CHOL 143 05/25/2013   HDL 46.90 05/25/2013   LDLCALC 77 05/25/2013   LDLDIRECT 137.3 05/25/2011   TRIG 98.0 05/25/2013   CHOLHDL 3 05/25/2013   Obesity- she is frustrated with her weight.  Weighs more than she has ever weighed.  She has been walking 2 miles 4 times per day.  Also stopped eating after 6 pm.  Using my fitness pal as well.   Wt Readings from Last 3 Encounters:  06/01/13 186 lb (84.369 kg)  10/01/12 182 lb 9.6 oz (82.827 kg)  09/04/12 181 lb 3.2 oz (82.192 kg)    Thyroid nodule-  Saw Dr. Biagio Quint in 09/2012.  Multinodular goiter with dominant nodule biopsy was negative for malignancy.  He recommended yearly ultrasounds (would be due in 09/2013).  Left great toe nail- discoloration for more than 6 months.  Failed OTC topical therapy.  Skin lesion- enlarging skin lesion on back of neck for years.  It is itchy and flaky.  Has tried dandruff shampoos but that made it worse.  No family or personal h/o psoriasis.  No other skin lesions.   Patient Active Problem List   Diagnosis Date Noted  . Overweight (BMI 25.0-29.9) 05/29/2012  . Routine general medical examination at a health care facility 05/29/2011  . SWELLING MASS OR LUMP IN HEAD AND NECK 01/10/2009  . ALLERGIC RHINITIS 04/11/2007  . Nontoxic multinodular goiter 06/09/2004   Past Medical History  Diagnosis Date  . Allergy    Past Surgical History  Procedure Laterality Date  . 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 Tongue biopsy  02/22/2009    [facility] surgeon    Family History  Problem Relation Age of Onset  . Diabetes Brother    . Obesity Brother   . Cancer Paternal Grandfather     bone   No Known Allergies Current Outpatient Prescriptions on File Prior to Visit  Medication Sig Dispense Refill  . naproxen sodium (ANAPROX) 220 MG tablet OTC as directed        No current facility-administered medications on file prior to visit.      Review of Systems       See HPI Patient reports no  vision/ hearing changes,anorexia, weight change, fever ,adenopathy, persistant / recurrent hoarseness, swallowing issues, chest pain, edema,persistant / recurrent cough, hemoptysis, dyspnea(rest, exertional, paroxysmal nocturnal), gastrointestinal  bleeding (melena, rectal bleeding), abdominal pain, excessive heart burn, GU symptoms(dysuria, hematuria, pyuria, voiding/incontinence  Issues) syncope, focal weakness, severe memory loss,  depression, anxiety, abnormal bruising/bleeding, major joint swelling, breast masses or abnormal vaginal bleeding.     Physical Exam BP 132/84  Pulse 84  Temp(Src) 98 F (36.7 C)  Ht 5' 6.5" (1.689 m)  Wt 186 lb (84.369 kg)  BMI 29.57 kg/m2  General:  Well-developed,well-nourished,in no acute distress; alert,appropriate and cooperative throughout examination Head:  normocephalic and atraumatic.   Eyes:  vision grossly intact, pupils equal, pupils round, and pupils reactive to light.   Ears:  R ear normal and L ear normal.   Nose:  no external deformity.   Breasts:  No mass, nodules, thickening, tenderness, bulging, retraction, inflamation, nipple discharge or skin changes noted.   Lungs:  Normal respiratory effort, chest expands symmetrically. Lungs are clear to auscultation, no crackles or wheezes. Heart:  Normal rate and regular rhythm. S1 and S2 normal without gallop, murmur, click, rub or other extra sounds. Abdomen:  Bowel sounds positive,abdomen soft and non-tender without masses, organomegaly or hernias noted. Msk:  No deformity or scoliosis noted of thoracic or lumbar spine.    Extremities:  No clubbing, cyanosis, edema, or deformity noted with normal full range of motion of all joints.   Neurologic:  alert & oriented X3 and gait normal.   Skin:   3 cm circular skin lesion on back of neck with scaly areas, no central clearing. Cervical Nodes:  No lymphadenopathy noted Axillary Nodes:  No palpable lymphadenopathy Psych:  Cognition and judgment appear intact. Alert and cooperative with normal attention span and concentration. No apparent delusions, illusions, hallucinations    Assessment and Plan: 1. Routine general medical examination at a health care facility Reviewed preventive care protocols, scheduled due services, and updated immunizations Discussed nutrition, exercise, diet, and healthy lifestyle.   2. Nail fungus Failed OTC therapy. Will treat with Lamisil 250 mg daily x 12 weeks. Return in 6 weeks for labs.   3. Skin lesion of scalp ? Psoriasis. She declined derm referral. Will try topical clobetasol x 2 weeks. She will update with symptoms. If no improvement, refer to derm. The patient indicates understanding of these issues and agrees with the plan.   4. Overweight (BMI 25.0-29.9) Deteriorated.  Refer to nutrionist. - Amb ref to Medical Nutrition Therapy-MNT

## 2013-06-05 ENCOUNTER — Ambulatory Visit
Admission: RE | Admit: 2013-06-05 | Discharge: 2013-06-05 | Disposition: A | Discharge status: Home / Self Care | Payer: BC Managed Care – PPO | Source: Ambulatory Visit

## 2013-06-05 DIAGNOSIS — Z1231 Encounter for screening mammogram for malignant neoplasm of breast: Secondary | ICD-10-CM

## 2013-06-08 ENCOUNTER — Telehealth: Payer: Self-pay | Admitting: *Deleted

## 2013-06-08 NOTE — Telephone Encounter (Signed)
Prior [name] is needed for terbinafine.  Advised patient that she can get a 3 day supply of this from [facility] for $4.00, without using her insurance.  Pt wants to get script there.  Script cancelled at [facility], called to [facility] [address].

## 2013-07-14 ENCOUNTER — Other Ambulatory Visit: Payer: BC Managed Care – PPO

## 2013-07-15 ENCOUNTER — Other Ambulatory Visit: Payer: Self-pay | Admitting: Family Medicine

## 2013-07-15 ENCOUNTER — Other Ambulatory Visit (INDEPENDENT_AMBULATORY_CARE_PROVIDER_SITE_OTHER): Payer: BC Managed Care – PPO

## 2013-07-15 DIAGNOSIS — B351 Tinea unguium: Secondary | ICD-10-CM

## 2013-07-15 LAB — CBC WITH DIFFERENTIAL/PLATELET
Basophils Absolute: 0 10*3/uL (ref 0.0–0.1)
Eosinophils Absolute: 0.1 10*3/uL (ref 0.0–0.7)
Hemoglobin: 14.4 g/dL (ref 12.0–15.0)
Lymphocytes Relative: 18.8 % (ref 12.0–46.0)
Lymphs Abs: 1.2 10*3/uL (ref 0.7–4.0)
MCHC: 33.4 g/dL (ref 30.0–36.0)
Neutro Abs: 4.8 10*3/uL (ref 1.4–7.7)
RDW: 13.4 % (ref 11.5–14.6)

## 2013-07-15 LAB — AST: AST: 21 U/L (ref 0–37)

## 2013-11-13 ENCOUNTER — Ambulatory Visit: Payer: BC Managed Care – PPO | Admitting: Family Medicine

## 2014-05-18 ENCOUNTER — Other Ambulatory Visit: Payer: Self-pay

## 2014-05-18 DIAGNOSIS — Z1231 Encounter for screening mammogram for malignant neoplasm of breast: Secondary | ICD-10-CM

## 2014-06-08 ENCOUNTER — Other Ambulatory Visit: Payer: Self-pay | Admitting: Family Medicine

## 2014-06-08 DIAGNOSIS — Z136 Encounter for screening for cardiovascular disorders: Secondary | ICD-10-CM

## 2014-06-08 DIAGNOSIS — Z0000 Encounter for general adult medical examination without abnormal findings: Secondary | ICD-10-CM

## 2014-06-14 ENCOUNTER — Ambulatory Visit: Payer: BC Managed Care – PPO

## 2014-06-14 ENCOUNTER — Ambulatory Visit
Admission: RE | Admit: 2014-06-14 | Discharge: 2014-06-14 | Disposition: A | Discharge status: Home / Self Care | Payer: BC Managed Care – PPO | Source: Ambulatory Visit

## 2014-06-14 ENCOUNTER — Encounter (INDEPENDENT_AMBULATORY_CARE_PROVIDER_SITE_OTHER): Payer: Self-pay

## 2014-06-14 DIAGNOSIS — Z1231 Encounter for screening mammogram for malignant neoplasm of breast: Secondary | ICD-10-CM

## 2014-06-16 ENCOUNTER — Other Ambulatory Visit (INDEPENDENT_AMBULATORY_CARE_PROVIDER_SITE_OTHER): Payer: BC Managed Care – PPO

## 2014-06-16 DIAGNOSIS — Z136 Encounter for screening for cardiovascular disorders: Secondary | ICD-10-CM

## 2014-06-16 DIAGNOSIS — Z0000 Encounter for general adult medical examination without abnormal findings: Secondary | ICD-10-CM

## 2014-06-16 DIAGNOSIS — E042 Nontoxic multinodular goiter: Secondary | ICD-10-CM

## 2014-06-16 LAB — CBC WITH DIFFERENTIAL/PLATELET
BASOS PCT: 0.3 % (ref 0.0–3.0)
Basophils Absolute: 0 10*3/uL (ref 0.0–0.1)
EOS ABS: 0.1 10*3/uL (ref 0.0–0.7)
EOS PCT: 1.6 % (ref 0.0–5.0)
HEMATOCRIT: 43.8 % (ref 36.0–46.0)
HEMOGLOBIN: 14.9 g/dL (ref 12.0–15.0)
LYMPHS ABS: 1.6 10*3/uL (ref 0.7–4.0)
Lymphocytes Relative: 22.1 % (ref 12.0–46.0)
MCHC: 34.1 g/dL (ref 30.0–36.0)
MCV: 88.3 fl (ref 78.0–100.0)
MONO ABS: 0.6 10*3/uL (ref 0.1–1.0)
Monocytes Relative: 8.6 % (ref 3.0–12.0)
NEUTROS ABS: 4.9 10*3/uL (ref 1.4–7.7)
NEUTROS PCT: 67.4 % (ref 43.0–77.0)
Platelets: 246 10*3/uL (ref 150.0–400.0)
RBC: 4.96 Mil/uL (ref 3.87–5.11)
RDW: 12.9 % (ref 11.5–15.5)
WBC: 7.3 10*3/uL (ref 4.0–10.5)

## 2014-06-16 LAB — TSH: TSH: 0.32 u[IU]/mL — ABNORMAL LOW (ref 0.35–4.50)

## 2014-06-17 LAB — COMPREHENSIVE METABOLIC PANEL
ALBUMIN: 4.3 g/dL (ref 3.5–5.2)
ALK PHOS: 77 U/L (ref 39–117)
ALT: 20 U/L (ref 0–35)
AST: 21 U/L (ref 0–37)
BILIRUBIN TOTAL: 0.4 mg/dL (ref 0.2–1.2)
BUN: 15 mg/dL (ref 6–23)
CO2: 22 mEq/L (ref 19–32)
CREATININE: 0.9 mg/dL (ref 0.4–1.2)
Calcium: 9.2 mg/dL (ref 8.4–10.5)
Chloride: 108 mEq/L (ref 96–112)
GFR: 75.09 mL/min (ref >60.00)
GLUCOSE: 105 mg/dL — AB (ref 70–99)
POTASSIUM: 4.4 meq/L (ref 3.5–5.1)
Sodium: 140 mEq/L (ref 135–145)
Total Protein: 7 g/dL (ref 6.0–8.3)

## 2014-06-17 LAB — LIPID PANEL
CHOL/HDL RATIO: 3
CHOLESTEROL: 159 mg/dL (ref 0–200)
HDL: 55.2 mg/dL (ref >39.00)
LDL CALC: 77 mg/dL (ref 0–99)
NonHDL: 103.8
TRIGLYCERIDES: 134 mg/dL (ref 0.0–149.0)
VLDL: 26.8 mg/dL (ref 0.0–40.0)

## 2014-06-22 DIAGNOSIS — R7989 Other specified abnormal findings of blood chemistry: Secondary | ICD-10-CM | POA: Insufficient documentation

## 2014-06-23 ENCOUNTER — Ambulatory Visit (INDEPENDENT_AMBULATORY_CARE_PROVIDER_SITE_OTHER): Payer: BC Managed Care – PPO | Admitting: Family Medicine

## 2014-06-23 ENCOUNTER — Encounter: Payer: Self-pay | Admitting: Family Medicine

## 2014-06-23 VITALS — BP 126/78 | HR 91 | Temp 97.9°F | Ht 66.25 in | Wt 189.2 lb

## 2014-06-23 DIAGNOSIS — Z0000 Encounter for general adult medical examination without abnormal findings: Secondary | ICD-10-CM

## 2014-06-23 DIAGNOSIS — E663 Overweight: Secondary | ICD-10-CM

## 2014-06-23 DIAGNOSIS — E042 Nontoxic multinodular goiter: Secondary | ICD-10-CM

## 2014-06-23 DIAGNOSIS — R946 Abnormal results of thyroid function studies: Secondary | ICD-10-CM

## 2014-06-23 DIAGNOSIS — R7989 Other specified abnormal findings of blood chemistry: Secondary | ICD-10-CM

## 2014-06-23 LAB — TSH: TSH: 0.34 u[IU]/mL — AB (ref 0.35–4.50)

## 2014-06-23 LAB — T3, FREE: T3, Free: 3 pg/mL (ref 2.3–4.2)

## 2014-06-23 LAB — T4, FREE: Free T4: 0.8 ng/dL (ref 0.60–1.60)

## 2014-06-23 MED ORDER — CLOBETASOL PROPIONATE 0.05 % EX CREA: TOPICAL_CREAM | Freq: Two times a day (BID) | CUTANEOUS | Status: DC

## 2014-06-23 NOTE — Patient Instructions (Signed)
Great to see you. Please stop by to see [name] on your way out.  I will call you with your lab results.   

## 2014-06-23 NOTE — Progress Notes (Signed)
43yo pleasant female here for CPX.  Well woman- no h/o abnormal pap smears. 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  Clobetasol cream prn does help with the itching- uses infrequently- sometimes once a month.  Last pap smear 05/29/12 (done by me). No family h/o cervical, uterine or breast CA.  Mammogram 06/15/14.    Lab Results  Component Value Date   CHOL 159 06/16/2014   HDL 55.20 06/16/2014   LDLCALC 77 06/16/2014   LDLDIRECT 137.3 05/25/2011   TRIG 134.0 06/16/2014   CHOLHDL 3 06/16/2014     Thyroid nodule- TSH was low this month.  She denies any symptoms of hyper or hypothyroidism. Lab Results  Component Value Date   TSH 0.32* 06/16/2014     Saw Dr. Biagio Quint in 09/2012.  Multinodular goiter with dominant nodule biopsy was negative for malignancy.  He recommended yearly ultrasounds (would have been due in 09/2013).   Wt Readings from Last 3 Encounters:  06/23/14 189 lb 4 oz (85.843 kg)  06/01/13 186 lb (84.369 kg)  10/01/12 182 lb 9.6 oz (82.827 kg)    Patient Active Problem List   Diagnosis Date Noted  . Low TSH level 06/22/2014  . Nail fungus 06/01/2013  . Skin lesion of scalp 06/01/2013  . Overweight (BMI 25.0-29.9) 05/29/2012  . Routine general medical examination at a health care facility 05/29/2011  . SWELLING MASS OR LUMP IN HEAD AND NECK 01/10/2009  . ALLERGIC RHINITIS 04/11/2007  . Nontoxic multinodular goiter 06/09/2004   Past Medical History  Diagnosis Date  . Allergy    Past Surgical History  Procedure Laterality Date  . 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 Tongue biopsy  02/22/2009    [facility] surgeon    Family History  Problem Relation Age of Onset  . Diabetes Brother   . Obesity Brother   . Cancer Paternal Grandfather     bone   No Known Allergies Current Outpatient Prescriptions on File Prior to Visit  Medication Sig Dispense Refill  . naproxen sodium (ANAPROX) 220 MG tablet OTC as directed        No current facility-administered medications  on file prior to visit.      Review of Systems       See HPI Patient reports no  vision/ hearing changes,anorexia, weight change, fever ,adenopathy, persistant / recurrent hoarseness, swallowing issues, chest pain, edema,persistant / recurrent cough, hemoptysis, dyspnea(rest, exertional, paroxysmal nocturnal), gastrointestinal  bleeding (melena, rectal bleeding), abdominal pain, excessive heart burn, GU symptoms(dysuria, hematuria, pyuria, voiding/incontinence  Issues) syncope, focal weakness, severe memory loss,  depression, anxiety, abnormal bruising/bleeding, major joint swelling, breast masses or abnormal vaginal bleeding.     Physical Exam BP 126/78  Pulse 91  Temp(Src) 97.9 F (36.6 C) (Oral)  Ht 5' 6.25" (1.683 m)  Wt 189 lb 4 oz (85.843 kg)  BMI 30.31 kg/m2  SpO2 97%  LMP 06/23/2014  General:  Well-developed,well-nourished,in no acute distress; alert,appropriate and cooperative throughout examination Head:  normocephalic and atraumatic.   Eyes:  vision grossly intact, pupils equal, pupils round, and pupils reactive to light.   Ears:  R ear normal and L ear normal.   Nose:  no external deformity.   Neck:  +goiter, non tender, no masses appreciated Breasts:  No mass, nodules, thickening, tenderness, bulging, retraction, inflamation, nipple discharge or skin changes noted.   Lungs:  Normal respiratory effort, chest expands symmetrically. Lungs are clear to auscultation, no crackles or wheezes. Heart:  Normal rate and regular rhythm. S1 and  S2 normal without gallop, murmur, click, rub or other extra sounds. Abdomen:  Bowel sounds positive,abdomen soft and non-tender without masses, organomegaly or hernias noted. Msk:  No deformity or scoliosis noted of thoracic or lumbar spine.   Extremities:  No clubbing, cyanosis, edema, or deformity noted with normal full range of motion of all joints.   Neurologic:  alert & oriented X3 and gait normal.   Cervical Nodes:  No lymphadenopathy  noted Axillary Nodes:  No palpable lymphadenopathy Psych:  Cognition and judgment appear intact. Alert and cooperative with normal attention span and concentration. No apparent delusions, illusions, hallucinations

## 2014-06-23 NOTE — Assessment & Plan Note (Signed)
Asymptomatic. See above.

## 2014-06-23 NOTE — Assessment & Plan Note (Signed)
Reviewed preventive care protocols, scheduled due services, and updated immunizations Discussed nutrition, exercise, diet, and healthy lifestyle.  Due for pap smear next year.

## 2014-06-23 NOTE — Assessment & Plan Note (Signed)
Due for ultrasound- ordered today. Now with low TSH- will check complete thyroid panel today. Orders Placed This Encounter  Procedures  . US Soft Tissue Head/Neck  . TSH  . T4, Free  . T3, Free

## 2014-07-09 ENCOUNTER — Ambulatory Visit
Admission: RE | Admit: 2014-07-09 | Discharge: 2014-07-09 | Disposition: A | Discharge status: Home / Self Care | Payer: BC Managed Care – PPO | Source: Ambulatory Visit | Attending: Family Medicine | Admitting: Family Medicine

## 2014-07-09 DIAGNOSIS — E042 Nontoxic multinodular goiter: Secondary | ICD-10-CM

## 2014-07-09 DIAGNOSIS — R7989 Other specified abnormal findings of blood chemistry: Secondary | ICD-10-CM

## 2014-08-27 ENCOUNTER — Ambulatory Visit: Payer: BC Managed Care – PPO | Admitting: Family Medicine

## 2014-08-27 ENCOUNTER — Encounter: Payer: Self-pay | Admitting: Family Medicine

## 2014-08-27 ENCOUNTER — Ambulatory Visit (INDEPENDENT_AMBULATORY_CARE_PROVIDER_SITE_OTHER): Payer: BC Managed Care – PPO | Admitting: Family Medicine

## 2014-08-27 VITALS — BP 114/78 | HR 97 | Temp 97.9°F | Wt 196.5 lb

## 2014-08-27 DIAGNOSIS — R946 Abnormal results of thyroid function studies: Secondary | ICD-10-CM

## 2014-08-27 DIAGNOSIS — E042 Nontoxic multinodular goiter: Secondary | ICD-10-CM

## 2014-08-27 DIAGNOSIS — R7989 Other specified abnormal findings of blood chemistry: Secondary | ICD-10-CM

## 2014-08-27 LAB — T4, FREE: FREE T4: 1.01 ng/dL (ref 0.60–1.60)

## 2014-08-27 LAB — TSH: TSH: 0.37 u[IU]/mL (ref 0.35–4.50)

## 2014-08-27 LAB — T3, FREE: T3 FREE: 3.1 pg/mL (ref 2.3–4.2)

## 2014-08-27 NOTE — Patient Instructions (Signed)
Great to see you.  We will call you with your lab results. 

## 2014-08-27 NOTE — Assessment & Plan Note (Signed)
Reassuring ultrasound. Repeat US in 1 year.

## 2014-08-27 NOTE — Progress Notes (Signed)
Subjective:   Patient ID: [name], female    DOB: 04/22/1971, 43 y.o.   MRN: [id number]  [name] is a pleasant 43 y.o. year old female who presents to clinic today with Follow-up  on 08/27/2014  HPI: H/o multinodular goiter.   In 2013- neg biopsy.    TSH was low two months ago.  She denies any symptoms of hyper or hypothyroidism. Lab Results  Component Value Date   TSH 0.32* 06/16/2014     Ultrasound reassuring from 07/09/14:   CLINICAL DATA: thyroid goiter with low TSH, previous FNA of  dominant left lesion 09/25/2012 and 05/03/2005  EXAM:  THYROID ULTRASOUND  TECHNIQUE:  Ultrasound examination of the thyroid gland and adjacent soft  tissues was performed.  COMPARISON: 08/21/2012  FINDINGS:  Right thyroid lobe  Measurements: 55 x 14 x 23 mm. Mildly heterogeneous echotexture. 6 x  4 x 7 mm hypoechoic nodule, superior pole. Adjacent 5 mm hypoechoic  nodule. 10 x 4 x 7 mm complex nodule, inferior pole. Additional  smaller nodules less than 3 mm.  Left thyroid lobe  Measurements: 49 x 13 x 19 mm. 8 x 5 x 5 mm complex cystic, mid lobe  (previously 41 x 27 x 29 before aspiration). 8 x 3 x 8 mm complex  cystic, inferior pole  Isthmus  Thickness: 3 mm. No nodules visualized.  Lymphadenopathy  None visualized.  IMPRESSION:  1. Thyromegaly with bilateral small nodules. Findings do not meet  current consensus criteria for biopsy. Follow-up by clinical exam is  recommended. If patient has known risk factors for thyroid  carcinoma, consider follow-up ultrasound in 12 months. If patient is  clinically hyperthyroid, consider nuclear medicine thyroid uptake  and scan. This recommendation follows the consensus statement:  Management of Thyroid Nodules Detected as [place]: Society of  Radiologists in Ultrasound Consensus Conference Statement. Radiology  2005; [id number].  Electronically Signed  By: Oley Balm M.D.  On: 07/09/2014 11:28   Review of Systems See HPI No  dysphagia No tremor No changes in bowel habits No palpitations Has actually gained weigh, not lost weight  Wt Readings from Last 3 Encounters:  08/27/14 196 lb 8 oz (89.132 kg)  06/23/14 189 lb 4 oz (85.843 kg)  06/01/13 186 lb (84.369 kg)       Objective:    BP 114/78  Pulse 97  Temp(Src) 97.9 F (36.6 C) (Oral)  Wt 196 lb 8 oz (89.132 kg)  SpO2 97%  LMP 08/13/2014   Physical Exam   General:  Well-developed,well-nourished,in no acute distress; alert,appropriate and cooperative throughout examination Head:  normocephalic and atraumatic.   Eyes:  vision grossly intact, pupils equal, pupils round, and pupils reactive to light.   Ears:  R ear normal and L ear normal.   Nose:  no external deformity.   Mouth:  good dentition.   Neck:  No deformities, masses, or tenderness noted. Msk:  No deformity or scoliosis noted of thoracic or lumbar spine.   Extremities:  No clubbing, cyanosis, edema, or deformity noted with normal full range of motion of all joints.   Neurologic:  alert & oriented X3 and gait normal.   Skin:  Intact without suspicious lesions or rashes Cervical Nodes:  No lymphadenopathy noted Axillary Nodes:  No palpable lymphadenopathy Psych:  Cognition and judgment appear intact. Alert and cooperative with normal attention span and concentration. No apparent delusions, illusions, hallucinations     Assessment & Plan:   Low TSH level - Plan:  T4, Free, T3, Free, TSH  Multiple thyroid nodules No Follow-up on file.

## 2014-08-27 NOTE — Assessment & Plan Note (Signed)
Repeat thyroid labs today. The patient indicates understanding of these issues and agrees with the plan.

## 2014-08-27 NOTE — Progress Notes (Signed)
Pre visit review using our clinic review tool, if applicable. No additional management support is needed unless otherwise documented below in the visit note. 

## 2014-08-31 ENCOUNTER — Encounter: Payer: Self-pay | Admitting: Family Medicine

## 2014-12-07 ENCOUNTER — Ambulatory Visit (INDEPENDENT_AMBULATORY_CARE_PROVIDER_SITE_OTHER): Payer: BC Managed Care – PPO | Admitting: Family Medicine

## 2014-12-07 ENCOUNTER — Encounter: Payer: Self-pay | Admitting: Family Medicine

## 2014-12-07 VITALS — BP 132/94 | HR 100 | Temp 98.3°F | Wt 195.5 lb

## 2014-12-07 DIAGNOSIS — R42 Dizziness and giddiness: Secondary | ICD-10-CM

## 2014-12-07 DIAGNOSIS — R7989 Other specified abnormal findings of blood chemistry: Secondary | ICD-10-CM

## 2014-12-07 DIAGNOSIS — R946 Abnormal results of thyroid function studies: Secondary | ICD-10-CM

## 2014-12-07 LAB — TSH: TSH: 0.76 u[IU]/mL (ref 0.35–4.50)

## 2014-12-07 LAB — T4, FREE: Free T4: 0.81 ng/dL (ref 0.60–1.60)

## 2014-12-07 MED ORDER — MECLIZINE HCL 25 MG PO TABS: 25.0000 mg | ORAL_TABLET | Freq: Two times a day (BID) | ORAL | Status: DC | PRN

## 2014-12-07 NOTE — Progress Notes (Signed)
BP 138/96 mmHg  Pulse 112  Temp(Src) 98.3 F (36.8 C) (Oral)  Wt 195 lb 8 oz (88.678 kg)  LMP 12/06/2014   CC: dizziness  Subjective:    Patient ID: [name], female    DOB: 09/04/1971, 43 y.o.   MRN: [id number]  HPI: [name] is a 43 y.o. female presenting on 12/07/2014 for Dizziness   Last Monday started noticing dizzy episodes that come and go. Ongoing over the past week. Describes dizziness and sense of motion, light vertigo. No lightheadedness, presyncope or imbalance. Episodes last 10-15 minutes. Towards end of episode head feels "thick".  No new meds recently.  Did have cold several weeks ago.  No fevers/chills, tinnitus, hearing changes, nausea.  Denies chest pain, dyspnea, palpitations or skipped beats. No headaches. No unilateral weakness, paresthesias or other neurological symptoms.   H/o nontoxic multinodular goiter, h/o subclinical hyperthyroidism.  Tachycardia and hypertension attributed by to "white coat syndrome". Also with salty lunch today.  Relevant past medical, surgical, family and social history reviewed and updated as indicated. Interim medical history since our last visit reviewed. Allergies and medications reviewed and updated.  Current Outpatient Prescriptions on File Prior to Visit  Medication Sig  . clobetasol cream (TEMOVATE) 0.05 % Apply topically 2 (two) times daily.  . naproxen sodium (ANAPROX) 220 MG tablet OTC as directed    No current facility-administered medications on file prior to visit.    Review of Systems Per HPI unless specifically indicated above     Objective:    BP 138/96 mmHg  Pulse 112  Temp(Src) 98.3 F (36.8 C) (Oral)  Wt 195 lb 8 oz (88.678 kg)  LMP 12/06/2014  Wt Readings from Last 3 Encounters:  12/07/14 195 lb 8 oz (88.678 kg)  08/27/14 196 lb 8 oz (89.132 kg)  06/23/14 189 lb 4 oz (85.843 kg)    Physical Exam  Constitutional: She is oriented to person, place, and time. She appears well-developed  and well-nourished. No distress.  HENT:  Head: Normocephalic and atraumatic.  Right Ear: Hearing, external ear and ear canal normal.  Left Ear: Hearing, external ear and ear canal normal.  Nose: No mucosal edema or rhinorrhea. Right sinus exhibits no maxillary sinus tenderness and no frontal sinus tenderness. Left sinus exhibits no maxillary sinus tenderness and no frontal sinus tenderness.  Mouth/Throat: Uvula is midline, oropharynx is clear and moist and mucous membranes are normal. No oropharyngeal exudate, posterior oropharyngeal edema, posterior oropharyngeal erythema or tonsillar abscesses.  Cerumen impaction bilaterally, cleared manually with plastic curette. TMs visualized and pearly grey  Eyes: Conjunctivae and EOM are normal. Pupils are equal, round, and reactive to light. No scleral icterus.  Neck: Normal range of motion. Neck supple.  Cardiovascular: Regular rhythm, normal heart sounds and intact distal pulses.  Tachycardia present.   No murmur heard. Pulmonary/Chest: Effort normal and breath sounds normal. No respiratory distress. She has no wheezes. She has no rales.  Lymphadenopathy:    She has no cervical adenopathy.  Neurological: She is alert and oriented to person, place, and time. No cranial nerve deficit. Coordination normal.  CN 2-12 intact Station and gait intact Neg dix hallpike  Skin: Skin is warm and dry. No rash noted.  Nursing note and vitals reviewed.      Assessment & Plan:   Problem List Items Addressed This Visit    Low TSH level   Relevant Orders      T4, free      TSH  Dizziness - Primary    Anticipate vestibular neuritis given relation to viral URI as well as description. Given thyroid history and noted tachycardia, will check TSH/free T4 to r/o symptomatic hyperthyroidism Treat sxs with meclizine. Pt education handout provided on vestibular neuritis. Pt agrees with plan.    Relevant Orders      T4, free      TSH       Follow up  plan: Return if symptoms worsen or fail to improve.

## 2014-12-07 NOTE — Patient Instructions (Signed)
I think you have vestibular neuritis - see below Treat with rest and meclizine as needed (caution it may make you sleepy). If not improving let us know. Thyroid check today.

## 2014-12-07 NOTE — Assessment & Plan Note (Signed)
Anticipate vestibular neuritis given relation to viral URI as well as description. Given thyroid history and noted tachycardia, will check TSH/free T4 to r/o symptomatic hyperthyroidism Treat sxs with meclizine. Pt education handout provided on vestibular neuritis. Pt agrees with plan.

## 2014-12-07 NOTE — Progress Notes (Signed)
Pre visit review using our clinic review tool, if applicable. No additional management support is needed unless otherwise documented below in the visit note. 

## 2015-05-23 ENCOUNTER — Other Ambulatory Visit: Payer: Self-pay

## 2015-05-23 DIAGNOSIS — Z1231 Encounter for screening mammogram for malignant neoplasm of breast: Secondary | ICD-10-CM

## 2015-06-15 ENCOUNTER — Other Ambulatory Visit: Payer: Self-pay | Admitting: Internal Medicine

## 2015-06-15 DIAGNOSIS — Z0000 Encounter for general adult medical examination without abnormal findings: Secondary | ICD-10-CM

## 2015-06-21 ENCOUNTER — Other Ambulatory Visit (INDEPENDENT_AMBULATORY_CARE_PROVIDER_SITE_OTHER): Payer: BC Managed Care – PPO

## 2015-06-21 DIAGNOSIS — Z0000 Encounter for general adult medical examination without abnormal findings: Secondary | ICD-10-CM

## 2015-06-21 LAB — COMPREHENSIVE METABOLIC PANEL
ALK PHOS: 73 U/L (ref 39–117)
ALT: 14 U/L (ref 0–35)
AST: 16 U/L (ref 0–37)
Albumin: 3.9 g/dL (ref 3.5–5.2)
BUN: 15 mg/dL (ref 6–23)
CHLORIDE: 104 meq/L (ref 96–112)
CO2: 27 meq/L (ref 19–32)
Calcium: 9 mg/dL (ref 8.4–10.5)
Creatinine, Ser: 0.82 mg/dL (ref 0.40–1.20)
GFR: 80.03 mL/min (ref >60.00)
Glucose, Bld: 97 mg/dL (ref 70–99)
Potassium: 4.2 mEq/L (ref 3.5–5.1)
Sodium: 138 mEq/L (ref 135–145)
Total Bilirubin: 0.5 mg/dL (ref 0.2–1.2)
Total Protein: 6.6 g/dL (ref 6.0–8.3)

## 2015-06-21 LAB — CBC
HCT: 42.4 % (ref 36.0–46.0)
Hemoglobin: 14.3 g/dL (ref 12.0–15.0)
MCHC: 33.6 g/dL (ref 30.0–36.0)
MCV: 88.7 fl (ref 78.0–100.0)
Platelets: 234 10*3/uL (ref 150.0–400.0)
RBC: 4.78 Mil/uL (ref 3.87–5.11)
RDW: 12.9 % (ref 11.5–15.5)
WBC: 6.9 10*3/uL (ref 4.0–10.5)

## 2015-06-21 LAB — LIPID PANEL
CHOL/HDL RATIO: 4
CHOLESTEROL: 175 mg/dL (ref 0–200)
HDL: 44.9 mg/dL (ref >39.00)
LDL Cholesterol: 101 mg/dL — ABNORMAL HIGH (ref 0–99)
NonHDL: 130.1
Triglycerides: 148 mg/dL (ref 0.0–149.0)
VLDL: 29.6 mg/dL (ref 0.0–40.0)

## 2015-06-21 LAB — T4, FREE: Free T4: 0.9 ng/dL (ref 0.60–1.60)

## 2015-06-21 LAB — TSH: TSH: 0.74 u[IU]/mL (ref 0.35–4.50)

## 2015-06-22 ENCOUNTER — Ambulatory Visit
Admission: RE | Admit: 2015-06-22 | Discharge: 2015-06-22 | Disposition: A | Discharge status: Home / Self Care | Payer: BC Managed Care – PPO | Source: Ambulatory Visit

## 2015-06-22 DIAGNOSIS — Z1231 Encounter for screening mammogram for malignant neoplasm of breast: Secondary | ICD-10-CM

## 2015-06-27 ENCOUNTER — Encounter: Payer: BC Managed Care – PPO | Admitting: Family Medicine

## 2015-06-30 ENCOUNTER — Encounter: Payer: Self-pay | Admitting: Family Medicine

## 2015-06-30 ENCOUNTER — Other Ambulatory Visit (HOSPITAL_COMMUNITY)
Admission: RE | Admit: 2015-06-30 | Discharge: 2015-06-30 | Disposition: A | Discharge status: Home / Self Care | Payer: BC Managed Care – PPO | Source: Ambulatory Visit | Attending: Family Medicine | Admitting: Family Medicine

## 2015-06-30 ENCOUNTER — Ambulatory Visit (INDEPENDENT_AMBULATORY_CARE_PROVIDER_SITE_OTHER): Payer: BC Managed Care – PPO | Admitting: Family Medicine

## 2015-06-30 VITALS — BP 114/62 | HR 99 | Temp 97.5°F | Ht 66.0 in | Wt 190.5 lb

## 2015-06-30 DIAGNOSIS — Z23 Encounter for immunization: Secondary | ICD-10-CM

## 2015-06-30 DIAGNOSIS — Z1151 Encounter for screening for human papillomavirus (HPV): Secondary | ICD-10-CM | POA: Insufficient documentation

## 2015-06-30 DIAGNOSIS — Z0000 Encounter for general adult medical examination without abnormal findings: Secondary | ICD-10-CM

## 2015-06-30 DIAGNOSIS — E042 Nontoxic multinodular goiter: Secondary | ICD-10-CM

## 2015-06-30 DIAGNOSIS — Z01419 Encounter for gynecological examination (general) (routine) without abnormal findings: Secondary | ICD-10-CM | POA: Insufficient documentation

## 2015-06-30 DIAGNOSIS — E663 Overweight: Secondary | ICD-10-CM

## 2015-06-30 NOTE — Assessment & Plan Note (Addendum)
Reviewed preventive care protocols, scheduled due services, and updated immunizations Discussed nutrition, exercise, diet, and healthy lifestyle.  Pap smear today.  Tdap today.

## 2015-06-30 NOTE — Addendum Note (Signed)
Addended by: Dianne Dun on: 06/30/2015 08:29 AM   Modules accepted: Kipp Brood

## 2015-06-30 NOTE — Patient Instructions (Signed)
Good to see you.  Have a wonderful rest of your summer!

## 2015-06-30 NOTE — Addendum Note (Signed)
Addended by: Desmond Dike on: 06/30/2015 08:39 AM   Modules accepted: Orders

## 2015-06-30 NOTE — Assessment & Plan Note (Signed)
Follow up thyroid US ordered 

## 2015-06-30 NOTE — Progress Notes (Signed)
44 yo pleasant female here for CPX and follow up of chronic medical conditions.  Well woman- no h/o abnormal pap smears. 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  Clobetasol cream prn does help with the itching- uses infrequently- sometimes once a month.  Last pap smear 05/29/12 (done by me). No family h/o cervical, uterine or breast CA.  Mammogram 06/22/15.  Lab Results  Component Value Date   CHOL 175 06/21/2015   HDL 44.90 06/21/2015   LDLCALC 101* 06/21/2015   LDLDIRECT 137.3 05/25/2011   TRIG 148.0 06/21/2015   CHOLHDL 4 06/21/2015    Thyroid nodule- TSH was low this month.  She denies any symptoms of hyper or hypothyroidism. Lab Results  Component Value Date   TSH 0.74 06/21/2015    Saw Dr. Biagio Quint in 09/2012.  Multinodular goiter with dominant nodule biopsy was negative for malignancy.  He recommended yearly ultrasounds- last [place] 06/29/14.  Lab Results  Component Value Date   WBC 6.9 06/21/2015   HGB 14.3 06/21/2015   HCT 42.4 06/21/2015   MCV 88.7 06/21/2015   PLT 234.0 06/21/2015   Lab Results  Component Value Date   NA 138 06/21/2015   K 4.2 06/21/2015   CL 104 06/21/2015   CO2 27 06/21/2015   Lab Results  Component Value Date   ALT 14 06/21/2015   AST 16 06/21/2015   ALKPHOS 73 06/21/2015   BILITOT 0.5 06/21/2015    Wt Readings from Last 3 Encounters:  06/30/15 190 lb 8 oz (86.41 kg)  12/07/14 195 lb 8 oz (88.678 kg)  08/27/14 196 lb 8 oz (89.132 kg)    Patient Active Problem List   Diagnosis Date Noted  . Multiple thyroid nodules 08/27/2014  . Low TSH level 06/22/2014  . Overweight (BMI 25.0-29.9) 05/29/2012  . Routine general medical examination at a health care facility 05/29/2011  . ALLERGIC RHINITIS 04/11/2007  . Nontoxic multinodular goiter 06/09/2004   Past Medical History  Diagnosis Date  . Allergy    Past Surgical History  Procedure Laterality Date  . 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 Tongue biopsy  02/22/2009    oral  surgeon    Family History  Problem Relation Age of Onset  . Diabetes Brother   . Obesity Brother   . Cancer Paternal Grandfather     bone   No Known Allergies Current Outpatient Prescriptions on File Prior to Visit  Medication Sig Dispense Refill  . clobetasol cream (TEMOVATE) 0.05 % Apply topically 2 (two) times daily. 30 g 0  . naproxen sodium (ANAPROX) 220 MG tablet OTC as directed      No current facility-administered medications on file prior to visit.       Review of Systems  Constitutional: Negative.   HENT: Negative.   Eyes: Negative.   Respiratory: Negative.   Cardiovascular: Negative.   Gastrointestinal: Negative.   Endocrine: Negative.   Genitourinary: Negative.   Musculoskeletal: Negative.   Skin: Negative.   Allergic/Immunologic: Negative.   Neurological: Negative.   Hematological: Negative.   Psychiatric/Behavioral: Negative.       Physical Exam BP 114/62 mmHg  Pulse 99  Temp(Src) 97.5 F (36.4 C) (Oral)  Ht  (1.676 m)  Wt 190 lb 8 oz (86.41 kg)  BMI 30.76 kg/m2  SpO2 99%  LMP 06/20/2015  General:  Well-developed,well-nourished,in no acute distress; alert,appropriate and cooperative throughout examination Head:  normocephalic and atraumatic.   Eyes:  vision grossly intact, pupils equal, pupils round, and pupils reactive  to light.   Ears:  R ear normal and L ear normal.   Nose:  no external deformity.   Neck:  +goiter, non tender, no masses appreciated Breasts:  No mass, nodules, thickening, tenderness, bulging, retraction, inflamation, nipple discharge or skin changes noted.   Lungs:  Normal respiratory effort, chest expands symmetrically. Lungs are clear to auscultation, no crackles or wheezes. Heart:  Normal rate and regular rhythm. S1 and S2 normal without gallop, murmur, click, rub or other extra sounds. Abdomen:  Bowel sounds positive,abdomen soft and non-tender without masses, organomegaly or hernias noted. Rectal:  no external  abnormalities.   Genitalia:  Pelvic Exam:        External: normal female genitalia without lesions or masses        Vagina: normal without lesions or masses        Cervix: normal without lesions or masses        Adnexa: normal bimanual exam without masses or fullness        Uterus: normal by palpation        Pap smear: performed Msk:  No deformity or scoliosis noted of thoracic or lumbar spine.   Extremities:  No clubbing, cyanosis, edema, or deformity noted with normal full range of motion of all joints.   Neurologic:  alert & oriented X3 and gait normal.   Skin:  Intact without suspicious lesions or rashes Cervical Nodes:  No lymphadenopathy noted Axillary Nodes:  No palpable lymphadenopathy Psych:  Cognition and judgment appear intact. Alert and cooperative with normal attention span and concentration. No apparent delusions, illusions, hallucinations

## 2015-06-30 NOTE — Addendum Note (Signed)
Addended by: Desmond Dike on: 06/30/2015 08:37 AM   Modules accepted: Orders

## 2015-06-30 NOTE — Progress Notes (Signed)
Pre visit review using our clinic review tool, if applicable. No additional management support is needed unless otherwise documented below in the visit note. 

## 2015-07-04 ENCOUNTER — Encounter: Payer: Self-pay | Admitting: *Deleted

## 2015-07-04 LAB — CYTOLOGY - PAP

## 2015-07-08 ENCOUNTER — Ambulatory Visit
Admission: RE | Admit: 2015-07-08 | Discharge: 2015-07-08 | Disposition: A | Discharge status: Home / Self Care | Payer: BC Managed Care – PPO | Source: Ambulatory Visit | Attending: Family Medicine | Admitting: Family Medicine

## 2015-07-08 DIAGNOSIS — E042 Nontoxic multinodular goiter: Secondary | ICD-10-CM

## 2015-12-15 ENCOUNTER — Encounter (HOSPITAL_COMMUNITY): Payer: Self-pay | Admitting: Emergency Medicine

## 2015-12-15 ENCOUNTER — Emergency Department (INDEPENDENT_AMBULATORY_CARE_PROVIDER_SITE_OTHER): Payer: BC Managed Care – PPO

## 2015-12-15 ENCOUNTER — Emergency Department (HOSPITAL_COMMUNITY)
Admission: EM | Admit: 2015-12-15 | Discharge: 2015-12-15 | Disposition: A | Discharge status: Home / Self Care | Payer: BC Managed Care – PPO | Source: Home / Self Care | Attending: Family Medicine | Admitting: Family Medicine

## 2015-12-15 DIAGNOSIS — S93401A Sprain of unspecified ligament of right ankle, initial encounter: Secondary | ICD-10-CM | POA: Diagnosis not present

## 2015-12-15 NOTE — ED Provider Notes (Signed)
CSN: [id number]     Arrival date & time 12/15/15  1308 History   First MD Initiated Contact with Patient 12/15/15 1359     Chief Complaint  Patient presents with  . Ankle Injury   (Consider location/radiation/quality/duration/timing/severity/associated sxs/prior Treatment) HPI History obtained from patient:   LOCATION:RIGHT ANKLE SEVERITY:2-3 DURATION:several days CONTEXT: Misjudged bottom step, causing her to twist ankle QUALITY: similar to previous ankle injuries MODIFYING FACTORS: cold packs, tylenol, ankle supports ASSOCIATED SYMPTOMS:bruise TIMING:constant OCCUPATION: teacher  Past Medical History  Diagnosis Date  . Allergy    Past Surgical History  Procedure Laterality Date  . 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 Tongue biopsy  02/22/2009    oral surgeon   Family History  Problem Relation Age of Onset  . Diabetes Brother   . Obesity Brother   . Cancer Paternal Grandfather     bone   Social History  Substance Use Topics  . Smoking status: Never Smoker   . Smokeless tobacco: Never Used  . Alcohol Use: No   OB History    No data available     Review of Systems ROS +'ve RIGHT ANKLE INJURY  Denies: HEADACHE, NAUSEA, ABDOMINAL PAIN, CHEST PAIN, CONGESTION, DYSURIA, SHORTNESS OF BREATH  Allergies  Review of patient's allergies indicates no known allergies.  Home Medications   Prior to Admission medications   Medication Sig Start Date End Date Taking? Authorizing Provider  clobetasol cream (TEMOVATE) 0.05 % Apply topically 2 (two) times daily. 06/23/14   Dianne Dunalia M Aron, MD  naproxen sodium (ANAPROX) 220 MG tablet OTC as directed     Historical Provider, MD   Meds Ordered and Administered this Visit  Medications - No data to display  BP 146/78 mmHg  Pulse 104  Temp(Src) 98.2 F (36.8 C) (Oral)  Resp 16  SpO2 99%  LMP 12/04/2015 No data found.   Physical Exam  Constitutional: She appears well-developed and well-nourished.  HENT:  Head: Normocephalic and  atraumatic.  Pulmonary/Chest: Effort normal.  Neurological: She is alert.  Skin: Skin is warm and dry.  Psychiatric: She has a normal mood and affect. Her behavior is normal. Judgment and thought content normal.  Nursing note and vitals reviewed.   ED Course  Procedures (including critical care time)  Labs Review Labs Reviewed - No data to display  Imaging Review Dg Ankle Complete Right  12/15/2015  CLINICAL DATA:  Pain following fall EXAM: RIGHT ANKLE - COMPLETE 3+ VIEW COMPARISON:  None. FINDINGS: Frontal, oblique, and lateral views were obtained. There is mild soft tissue swelling. There is no demonstrable fracture or joint effusion. The ankle mortise appears intact. No appreciable joint space narrowing. IMPRESSION: Soft tissue swelling.  No fracture.  Mortise intact. Electronically Signed   By: Bretta BangWilliam  Woodruff III M.D.   On: 12/15/2015 14:16     Visual Acuity Review  Right Eye Distance:   Left Eye Distance:   Bilateral Distance:    Right Eye Near:   Left Eye Near:    Bilateral Near:         MDM   1. Ankle sprain, right, initial encounter    Continue symptomatic care and follow up as needed.  Activity as tolerated.     Tharon AquasFrank C Kristiann Noyce, PA 12/15/15 [phone]

## 2015-12-15 NOTE — ED Notes (Signed)
Pt reports she twisted her right ankle on 12/27 when she fell on the porch Sx today include swelling, pain, and bruising Steady gait... A&O x4... No acute distress.

## 2015-12-15 NOTE — Discharge Instructions (Signed)
Acute Ankle Sprain With Phase I Rehab An acute ankle sprain is a partial or complete tear in one or more of the ligaments of the ankle due to traumatic injury. The severity of the injury depends on both the number of ligaments sprained and the grade of sprain. There are 3 grades of sprains.   A grade 1 sprain is a mild sprain. There is a slight pull without obvious tearing. There is no loss of strength, and the muscle and ligament are the correct length.  A grade 2 sprain is a moderate sprain. There is tearing of fibers within the substance of the ligament where it connects two bones or two cartilages. The length of the ligament is increased, and there is usually decreased strength.  A grade 3 sprain is a complete rupture of the ligament and is uncommon. In addition to the grade of sprain, there are three types of ankle sprains.  Lateral ankle sprains: This is a sprain of one or more of the three ligaments on the outer side (lateral) of the ankle. These are the most common sprains. Medial ankle sprains: There is one large triangular ligament of the inner side (medial) of the ankle that is susceptible to injury. Medial ankle sprains are less common. Syndesmosis, "high ankle," sprains: The syndesmosis is the ligament that connects the two bones of the lower leg. Syndesmosis sprains usually only occur with very severe ankle sprains. SYMPTOMS  Pain, tenderness, and swelling in the ankle, starting at the side of injury that may progress to the whole ankle and foot with time.  "Pop" or tearing sensation at the time of injury.  Bruising that may spread to the heel.  Impaired ability to walk soon after injury. CAUSES   Acute ankle sprains are caused by trauma placed on the ankle that temporarily forces or pries the anklebone (talus) out of its normal socket.  Stretching or tearing of the ligaments that normally hold the joint in place (usually due to a twisting injury). RISK INCREASES  WITH:  Previous ankle sprain.  Sports in which the foot may land awkwardly (i.e., basketball, volleyball, or soccer) or walking or running on uneven or rough surfaces.  Shoes with inadequate support to prevent sideways motion when stress occurs.  Poor strength and flexibility.  Poor balance skills.  Contact sports. PREVENTION   Warm up and stretch properly before activity.  Maintain physical fitness:  Ankle and leg flexibility, muscle strength, and endurance.  Cardiovascular fitness.  Balance training activities.  Use proper technique and have a coach correct improper technique.  Taping, protective strapping, bracing, or high-top tennis shoes may help prevent injury. Initially, tape is best; however, it loses most of its support function within 10 to 15 minutes.  Wear proper-fitted protective shoes (High-top shoes with taping or bracing is more effective than either alone).  Provide the ankle with support during sports and practice activities for 12 months following injury. PROGNOSIS   If treated properly, ankle sprains can be expected to recover completely; however, the length of recovery depends on the degree of injury.  A grade 1 sprain usually heals enough in 5 to 7 days to allow modified activity and requires an average of 6 weeks to heal completely.  A grade 2 sprain requires 6 to 10 weeks to heal completely.  A grade 3 sprain requires 12 to 16 weeks to heal.  A syndesmosis sprain often takes more than 3 months to heal. RELATED COMPLICATIONS   Frequent recurrence of symptoms may  result in a chronic problem. Appropriately addressing the problem the first time decreases the frequency of recurrence and optimizes healing time. Severity of the initial sprain does not predict the likelihood of later instability.  Injury to other structures (bone, cartilage, or tendon).  A chronically unstable or arthritic ankle joint is a possibility with repeated  sprains. TREATMENT Treatment initially involves the use of ice, medication, and compression bandages to help reduce pain and inflammation. Ankle sprains are usually immobilized in a walking cast or boot to allow for healing. Crutches may be recommended to reduce pressure on the injury. After immobilization, strengthening and stretching exercises may be necessary to regain strength and a full range of motion. Surgery is rarely needed to treat ankle sprains. MEDICATION   Nonsteroidal anti-inflammatory medications, such as aspirin and ibuprofen (do not take for the first 3 days after injury or within 7 days before surgery), or other minor pain relievers, such as acetaminophen, are often recommended. Take these as directed by your caregiver. Contact your caregiver immediately if any bleeding, stomach upset, or signs of an allergic reaction occur from these medications.  Ointments applied to the skin may be helpful.  Pain relievers may be prescribed as necessary by your caregiver. Do not take prescription pain medication for longer than 4 to 7 days. Use only as directed and only as much as you need. HEAT AND COLD  Cold treatment (icing) is used to relieve pain and reduce inflammation for acute and chronic cases. Cold should be applied for 10 to 15 minutes every 2 to 3 hours for inflammation and pain and immediately after any activity that aggravates your symptoms. Use ice packs or an ice massage.  Heat treatment may be used before performing stretching and strengthening activities prescribed by your caregiver. Use a heat pack or a warm soak. SEEK IMMEDIATE MEDICAL CARE IF:   Pain, swelling, or bruising worsens despite treatment.  You experience pain, numbness, discoloration, or coldness in the foot or toes.  New, unexplained symptoms develop (drugs used in treatment may produce side effects.) EXERCISES  PHASE I EXERCISES RANGE OF MOTION (ROM) AND STRETCHING EXERCISES - Ankle Sprain, Acute Phase I,  Weeks 1 to 2 These exercises may help you when beginning to restore flexibility in your ankle. You will likely work on these exercises for the 1 to 2 weeks after your injury. Once your physician, physical therapist, or athletic trainer sees adequate progress, he or she will advance your exercises. While completing these exercises, remember:   Restoring tissue flexibility helps normal motion to return to the joints. This allows healthier, less painful movement and activity.  An effective stretch should be held for at least 30 seconds.  A stretch should never be painful. You should only feel a gentle lengthening or release in the stretched tissue. RANGE OF MOTION - Dorsi/Plantar Flexion  While sitting with your right / left knee straight, draw the top of your foot upwards by flexing your ankle. Then reverse the motion, pointing your toes downward.  Hold each position for _____30_____ seconds.  After completing your first set of exercises, repeat this exercise with your knee bent. Repeat ______3____ times. Complete this exercise ______2____ times per day.  RANGE OF MOTION - Ankle Alphabet  Imagine your right / left big toe is a pen.  Keeping your hip and knee still, write out the entire alphabet with your "pen." Make the letters as large as you can without increasing any discomfort. Repeat _____3_____ times. Complete this exercise _____3_____  times per day.  STRENGTHENING EXERCISES - Ankle Sprain, Acute -Phase I, Weeks 1 to 2 These exercises may help you when beginning to restore strength in your ankle. You will likely work on these exercises for 1 to 2 weeks after your injury. Once your physician, physical therapist, or athletic trainer sees adequate progress, he or she will advance your exercises. While completing these exercises, remember:   Muscles can gain both the endurance and the strength needed for everyday activities through controlled exercises.  Complete these exercises as  instructed by your physician, physical therapist, or athletic trainer. Progress the resistance and repetitions only as guided.  You may experience muscle soreness or fatigue, but the pain or discomfort you are trying to eliminate should never worsen during these exercises. If this pain does worsen, stop and make certain you are following the directions exactly. If the pain is still present after adjustments, discontinue the exercise until you can discuss the trouble with your clinician. STRENGTH - Dorsiflexors  Secure a rubber exercise band/tubing to a fixed object (i.e., table, pole) and loop the other end around your right / left foot.  Sit on the floor facing the fixed object. The band/tubing should be slightly tense when your foot is relaxed.  Slowly draw your foot back toward you using your ankle and toes.  Hold this position for _____30_____ seconds. Slowly release the tension in the band and return your foot to the starting position. Repeat ____3______ times. Complete this exercise _____3_____ times per day.  STRENGTH - Plantar-flexors   Sit with your right / left leg extended. Holding onto both ends of a rubber exercise band/tubing, loop it around the ball of your foot. Keep a slight tension in the band.  Slowly push your toes away from you, pointing them downward.  Hold this position for __________ seconds. Return slowly, controlling the tension in the band/tubing. Repeat ______3____ times. Complete this exercise ____2______ times per day.  STRENGTH - Ankle Eversion  Secure one end of a rubber exercise band/tubing to a fixed object (table, pole). Loop the other end around your foot just before your toes.  Place your fists between your knees. This will focus your strengthening at your ankle.  Drawing the band/tubing across your opposite foot, slowly, pull your little toe out and up. Make sure the band/tubing is positioned to resist the entire motion.  Hold this position for  _____30____ seconds. Have your muscles resist the band/tubing as it slowly pulls your foot back to the starting position.  Repeat ______3____ times. Complete this exercise ________3__ times per day.  STRENGTH - Ankle Inversion  Secure one end of a rubber exercise band/tubing to a fixed object (table, pole). Loop the other end around your foot just before your toes.  Place your fists between your knees. This will focus your strengthening at your ankle.  Slowly, pull your big toe up and in, making sure the band/tubing is positioned to resist the entire motion.  Hold this position for ____15______ seconds.  Have your muscles resist the band/tubing as it slowly pulls your foot back to the starting position. Repeat _____4_____ times. Complete this exercises ________3__ times per day.  STRENGTH - Towel Curls  Sit in a chair positioned on a non-carpeted surface.  Place your right / left foot on a towel, keeping your heel on the floor.  Pull the towel toward your heel by only curling your toes. Keep your heel on the floor.  If instructed by your physician, physical therapist,  or athletic trainer, add weight to the end of the towel. Repeat _____3_____ times. Complete this exercise ___3_______ times per day.   This information is not intended to replace advice given to you by your health care provider. Make sure you discuss any questions you have with your health care provider.   Document Released: 06/27/2005 Document Revised: 12/17/2014 Document Reviewed: 03/10/2009 Elsevier Interactive Patient Education 2016 [employer]  Cryotherapy Cryotherapy means treatment with cold. Ice or gel packs can be used to reduce both pain and swelling. Ice is the most helpful within the first 24 to 48 hours after an injury or flare-up from overusing a muscle or joint. Sprains, strains, spasms, burning pain, shooting pain, and aches can all be eased with ice. Ice can also be used when recovering from surgery.  Ice is effective, has very few side effects, and is safe for most people to use. PRECAUTIONS  Ice is not a safe treatment option for people with:  Raynaud phenomenon. This is a condition affecting small blood vessels in the extremities. Exposure to cold may cause your problems to return.  Cold hypersensitivity. There are many forms of cold hypersensitivity, including:  Cold urticaria. Red, itchy hives appear on the skin when the tissues begin to warm after being iced.  Cold erythema. This is a red, itchy rash caused by exposure to cold.  Cold hemoglobinuria. Red blood cells break down when the tissues begin to warm after being iced. The hemoglobin that carry oxygen are passed into the urine because they cannot combine with blood proteins fast enough.  Numbness or altered sensitivity in the area being iced. If you have any of the following conditions, do not use ice until you have discussed cryotherapy with your caregiver:  Heart conditions, such as arrhythmia, angina, or chronic heart disease.  High blood pressure.  Healing wounds or open skin in the area being iced.  Current infections.  Rheumatoid arthritis.  Poor circulation.  Diabetes. Ice slows the blood flow in the region it is applied. This is beneficial when trying to stop inflamed tissues from spreading irritating chemicals to surrounding tissues. However, if you expose your skin to cold temperatures for too long or without the proper protection, you can damage your skin or nerves. Watch for signs of skin damage due to cold. HOME CARE INSTRUCTIONS Follow these tips to use ice and cold packs safely.  Place a dry or damp towel between the ice and skin. A damp towel will cool the skin more quickly, so you may need to shorten the time that the ice is used.  For a more rapid response, add gentle compression to the ice.  Ice for no more than 10 to 20 minutes at a time. The bonier the area you are icing, the less time it will  take to get the benefits of ice.  Check your skin after 5 minutes to make sure there are no signs of a poor response to cold or skin damage.  Rest 20 minutes or more between uses.  Once your skin is numb, you can end your treatment. You can test numbness by very lightly touching your skin. The touch should be so light that you do not see the skin dimple from the pressure of your fingertip. When using ice, most people will feel these normal sensations in this order: cold, burning, aching, and numbness.  Do not use ice on someone who cannot communicate their responses to pain, such as small children or people with dementia.  HOW TO MAKE AN ICE PACK Ice packs are the most common way to use ice therapy. Other methods include ice massage, ice baths, and cryosprays. Muscle creams that cause a cold, tingly feeling do not offer the same benefits that ice offers and should not be used as a substitute unless recommended by your caregiver. To make an ice pack, do one of the following:  Place crushed ice or a bag of frozen vegetables in a sealable plastic bag. Squeeze out the excess air. Place this bag inside another plastic bag. Slide the bag into a pillowcase or place a damp towel between your skin and the bag.  Mix 3 parts water with 1 part rubbing alcohol. Freeze the mixture in a sealable plastic bag. When you remove the mixture from the freezer, it will be slushy. Squeeze out the excess air. Place this bag inside another plastic bag. Slide the bag into a pillowcase or place a damp towel between your skin and the bag. SEEK MEDICAL CARE IF:  You develop white spots on your skin. This may give the skin a blotchy (mottled) appearance.  Your skin turns blue or pale.  Your skin becomes waxy or hard.  Your swelling gets worse. MAKE SURE YOU:   Understand these instructions.  Will watch your condition.  Will get help right away if you are not doing well or get worse.   This information is not  intended to replace advice given to you by your health care provider. Make sure you discuss any questions you have with your health care provider.   Document Released: 07/23/2011 Document Revised: 12/17/2014 Document Reviewed: 07/23/2011 Elsevier Interactive Patient Education [employer]

## 2016-06-25 ENCOUNTER — Other Ambulatory Visit: Payer: Self-pay | Admitting: Family Medicine

## 2016-06-25 DIAGNOSIS — Z1231 Encounter for screening mammogram for malignant neoplasm of breast: Secondary | ICD-10-CM

## 2016-06-28 ENCOUNTER — Ambulatory Visit
Admission: RE | Admit: 2016-06-28 | Discharge: 2016-06-28 | Disposition: A | Discharge status: Home / Self Care | Payer: BC Managed Care – PPO | Source: Ambulatory Visit | Attending: Family Medicine | Admitting: Family Medicine

## 2016-06-28 DIAGNOSIS — Z1231 Encounter for screening mammogram for malignant neoplasm of breast: Secondary | ICD-10-CM

## 2016-07-03 ENCOUNTER — Ambulatory Visit (INDEPENDENT_AMBULATORY_CARE_PROVIDER_SITE_OTHER): Payer: BC Managed Care – PPO | Admitting: Family Medicine

## 2016-07-03 VITALS — BP 114/62 | HR 93 | Temp 98.1°F | Ht 66.25 in | Wt 199.5 lb

## 2016-07-03 DIAGNOSIS — Z0000 Encounter for general adult medical examination without abnormal findings: Secondary | ICD-10-CM

## 2016-07-03 DIAGNOSIS — E663 Overweight: Secondary | ICD-10-CM

## 2016-07-03 DIAGNOSIS — E042 Nontoxic multinodular goiter: Secondary | ICD-10-CM | POA: Diagnosis not present

## 2016-07-03 DIAGNOSIS — J302 Other seasonal allergic rhinitis: Secondary | ICD-10-CM | POA: Diagnosis not present

## 2016-07-03 LAB — COMPREHENSIVE METABOLIC PANEL
ALBUMIN: 4.3 g/dL (ref 3.5–5.2)
ALT: 19 U/L (ref 0–35)
AST: 18 U/L (ref 0–37)
Alkaline Phosphatase: 77 U/L (ref 39–117)
BUN: 14 mg/dL (ref 6–23)
CALCIUM: 9.4 mg/dL (ref 8.4–10.5)
CHLORIDE: 104 meq/L (ref 96–112)
CO2: 28 meq/L (ref 19–32)
Creatinine, Ser: 0.85 mg/dL (ref 0.40–1.20)
GFR: 76.43 mL/min (ref >60.00)
GLUCOSE: 97 mg/dL (ref 70–99)
Potassium: 4 mEq/L (ref 3.5–5.1)
Sodium: 137 mEq/L (ref 135–145)
TOTAL PROTEIN: 7 g/dL (ref 6.0–8.3)
Total Bilirubin: 0.5 mg/dL (ref 0.2–1.2)

## 2016-07-03 LAB — CBC WITH DIFFERENTIAL/PLATELET
BASOS PCT: 0.5 % (ref 0.0–3.0)
Basophils Absolute: 0 10*3/uL (ref 0.0–0.1)
EOS ABS: 0.1 10*3/uL (ref 0.0–0.7)
EOS PCT: 0.9 % (ref 0.0–5.0)
HEMATOCRIT: 44 % (ref 36.0–46.0)
HEMOGLOBIN: 15 g/dL (ref 12.0–15.0)
LYMPHS PCT: 19.9 % (ref 12.0–46.0)
Lymphs Abs: 1.6 10*3/uL (ref 0.7–4.0)
MCHC: 34.1 g/dL (ref 30.0–36.0)
MCV: 88.5 fl (ref 78.0–100.0)
Monocytes Absolute: 0.5 10*3/uL (ref 0.1–1.0)
Monocytes Relative: 6.8 % (ref 3.0–12.0)
Neutro Abs: 5.8 10*3/uL (ref 1.4–7.7)
Neutrophils Relative %: 71.9 % (ref 43.0–77.0)
Platelets: 277 10*3/uL (ref 150.0–400.0)
RBC: 4.97 Mil/uL (ref 3.87–5.11)
RDW: 13.1 % (ref 11.5–15.5)
WBC: 8.1 10*3/uL (ref 4.0–10.5)

## 2016-07-03 LAB — LIPID PANEL
CHOL/HDL RATIO: 3
Cholesterol: 183 mg/dL (ref 0–200)
HDL: 53.4 mg/dL (ref >39.00)
LDL CALC: 92 mg/dL (ref 0–99)
NONHDL: 129.14
Triglycerides: 186 mg/dL — ABNORMAL HIGH (ref 0.0–149.0)
VLDL: 37.2 mg/dL (ref 0.0–40.0)

## 2016-07-03 LAB — TSH: TSH: 0.77 u[IU]/mL (ref 0.35–4.50)

## 2016-07-03 MED ORDER — CLOBETASOL PROPIONATE 0.05 % EX CREA: TOPICAL_CREAM | Freq: Two times a day (BID) | CUTANEOUS | 5 refills | Status: DC

## 2016-07-03 NOTE — Assessment & Plan Note (Signed)
Reviewed preventive care protocols, scheduled due services, and updated immunizations Discussed nutrition, exercise, diet, and healthy lifestyle.  

## 2016-07-03 NOTE — Progress Notes (Signed)
Pre visit review using our clinic review tool, if applicable. No additional management support is needed unless otherwise documented below in the visit note. 

## 2016-07-03 NOTE — Patient Instructions (Signed)
Great to see you. We will call you with your lab results and your ultrasound appointment.

## 2016-07-03 NOTE — Assessment & Plan Note (Signed)
Schedule follow up thyroid US The patient indicates understanding of these issues and agrees with the plan.

## 2016-07-03 NOTE — Progress Notes (Signed)
45 yo pleasant female here for CPX and follow up of chronic medical conditions.  Well woman- no h/o abnormal pap smears. 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  Clobetasol cream prn does help with the itching- uses infrequently- sometimes once a month.  Last pap smear 06/30/15 (done by me). No family h/o cervical, uterine or breast CA.  Mammogram 720/17  Lab Results  Component Value Date   CHOL 175 06/21/2015   HDL 44.90 06/21/2015   LDLCALC 101 (H) 06/21/2015   LDLDIRECT 137.3 05/25/2011   TRIG 148.0 06/21/2015   CHOLHDL 4 06/21/2015    Thyroid nodule- Due for labs.  She denies any symptoms of hyper or hypothyroidism. Lab Results  Component Value Date   TSH 0.74 06/21/2015    Saw Dr. Biagio Quint in 09/2012.  Multinodular goiter with dominant nodule biopsy was negative for malignancy.  He recommended yearly ultrasounds- last [place] 07/08/15- reviewed- stable [place].  Lab Results  Component Value Date   WBC 6.9 06/21/2015   HGB 14.3 06/21/2015   HCT 42.4 06/21/2015   MCV 88.7 06/21/2015   PLT 234.0 06/21/2015   Lab Results  Component Value Date   NA 138 06/21/2015   K 4.2 06/21/2015   CL 104 06/21/2015   CO2 27 06/21/2015   Lab Results  Component Value Date   ALT 14 06/21/2015   AST 16 06/21/2015   ALKPHOS 73 06/21/2015   BILITOT 0.5 06/21/2015    Wt Readings from Last 3 Encounters:  07/03/16 199 lb 8 oz (90.5 kg)  06/30/15 190 lb 8 oz (86.4 kg)  12/07/14 195 lb 8 oz (88.7 kg)    Patient Active Problem List  Diagnosis  . Nontoxic multinodular goiter  . Allergic rhinitis  . Routine general medical examination at a health care facility  . Overweight (BMI 25.0-29.9)  . Multiple thyroid nodules   Past Medical History:  Diagnosis Date  . Allergy    Past Surgical History:  Procedure Laterality Date  . TONGUE BIOPSY  02/22/2009   oral surgeon  . 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 Diabetes Brother   .  Obesity Brother   . Cancer Paternal Grandfather     bone   No Known Allergies Current Outpatient Prescriptions on File Prior to Visit  Medication Sig Dispense Refill  . naproxen sodium (ANAPROX) 220 MG tablet OTC as directed      No current facility-administered medications on file prior to visit.        Review of Systems  Constitutional: Negative.   HENT: Negative.   Eyes: Negative.   Respiratory: Negative.   Cardiovascular: Negative.   Gastrointestinal: Negative.   Endocrine: Negative.   Genitourinary: Negative.   Musculoskeletal: Negative.   Skin: Negative.   Allergic/Immunologic: Negative.   Neurological: Negative.   Hematological: Negative.   Psychiatric/Behavioral: Negative.       Physical Exam BP 114/62   Pulse 93   Temp 98.1 F (36.7 C) (Oral)   Ht 5' 6.25" (1.683 m)   Wt 199 lb 8 oz (90.5 kg)   LMP 06/09/2016   SpO2 98%   BMI 31.96 kg/m   General:  Well-developed,well-nourished,in no acute distress; alert,appropriate and cooperative throughout examination Head:  normocephalic and atraumatic.   Eyes:  vision grossly intact, pupils equal, pupils round, and pupils reactive to light.   Ears:  R ear normal and L ear normal.   Nose:  no external deformity.  Neck:  +goiter, non tender, no masses appreciated Breasts:  No mass, nodules, thickening, tenderness, bulging, retraction, inflamation, nipple discharge or skin changes noted.  Lungs:  Normal respiratory effort, chest expands symmetrically. Lungs are clear to auscultation, no crackles or wheezes. Heart:  Normal rate and regular rhythm. S1 and S2 normal without gallop, murmur, click, rub or other extra sounds. Abdomen:  Bowel sounds positive,abdomen soft and non-tender without masses, organomegaly or hernias noted. Msk:  No deformity or scoliosis noted of thoracic or lumbar spine.   Extremities:  No clubbing, cyanosis, edema, or deformity noted with normal full range of motion of all joints.   Neurologic:   alert & oriented X3 and gait normal.   Skin:  Intact without suspicious lesions or rashes Cervical Nodes:  No lymphadenopathy noted Axillary Nodes:  No palpable lymphadenopathy Psych:  Cognition and judgment appear intact. Alert and cooperative with normal attention span and concentration. No apparent delusions, illusions, hallucinations

## 2016-07-05 ENCOUNTER — Encounter: Payer: Self-pay | Admitting: *Deleted

## 2016-07-09 ENCOUNTER — Ambulatory Visit
Admission: RE | Admit: 2016-07-09 | Discharge: 2016-07-09 | Disposition: A | Discharge status: Home / Self Care | Payer: BC Managed Care – PPO | Source: Ambulatory Visit | Attending: Family Medicine | Admitting: Family Medicine

## 2016-07-09 DIAGNOSIS — E042 Nontoxic multinodular goiter: Secondary | ICD-10-CM

## 2016-07-10 ENCOUNTER — Encounter: Payer: Self-pay | Admitting: *Deleted

## 2017-05-20 ENCOUNTER — Other Ambulatory Visit: Payer: Self-pay | Admitting: Family Medicine

## 2017-05-20 DIAGNOSIS — Z1231 Encounter for screening mammogram for malignant neoplasm of breast: Secondary | ICD-10-CM

## 2017-07-01 ENCOUNTER — Ambulatory Visit
Admission: RE | Admit: 2017-07-01 | Discharge: 2017-07-01 | Disposition: A | Discharge status: Home / Self Care | Payer: BC Managed Care – PPO | Source: Ambulatory Visit | Attending: Family Medicine | Admitting: Family Medicine

## 2017-07-01 DIAGNOSIS — Z1231 Encounter for screening mammogram for malignant neoplasm of breast: Secondary | ICD-10-CM

## 2017-07-04 ENCOUNTER — Ambulatory Visit (INDEPENDENT_AMBULATORY_CARE_PROVIDER_SITE_OTHER): Payer: BC Managed Care – PPO | Admitting: Family Medicine

## 2017-07-04 ENCOUNTER — Other Ambulatory Visit (HOSPITAL_COMMUNITY)
Admission: RE | Admit: 2017-07-04 | Discharge: 2017-07-04 | Disposition: A | Discharge status: Home / Self Care | Payer: BC Managed Care – PPO | Source: Ambulatory Visit | Attending: Family Medicine | Admitting: Family Medicine

## 2017-07-04 ENCOUNTER — Encounter: Payer: Self-pay | Admitting: Family Medicine

## 2017-07-04 VITALS — BP 120/88 | HR 92 | Ht 66.5 in | Wt 199.0 lb

## 2017-07-04 DIAGNOSIS — Z01419 Encounter for gynecological examination (general) (routine) without abnormal findings: Secondary | ICD-10-CM | POA: Insufficient documentation

## 2017-07-04 DIAGNOSIS — Z124 Encounter for screening for malignant neoplasm of cervix: Secondary | ICD-10-CM | POA: Diagnosis not present

## 2017-07-04 DIAGNOSIS — Z0000 Encounter for general adult medical examination without abnormal findings: Secondary | ICD-10-CM | POA: Insufficient documentation

## 2017-07-04 DIAGNOSIS — E042 Nontoxic multinodular goiter: Secondary | ICD-10-CM | POA: Diagnosis not present

## 2017-07-04 LAB — CBC WITH DIFFERENTIAL/PLATELET
BASOS ABS: 0.1 10*3/uL (ref 0.0–0.1)
BASOS PCT: 1 % (ref 0.0–3.0)
EOS ABS: 0.3 10*3/uL (ref 0.0–0.7)
Eosinophils Relative: 4 % (ref 0.0–5.0)
HEMATOCRIT: 43.4 % (ref 36.0–46.0)
HEMOGLOBIN: 14.7 g/dL (ref 12.0–15.0)
LYMPHS PCT: 18 % (ref 12.0–46.0)
Lymphs Abs: 1.2 10*3/uL (ref 0.7–4.0)
MCHC: 33.9 g/dL (ref 30.0–36.0)
MCV: 90.1 fl (ref 78.0–100.0)
MONO ABS: 0.7 10*3/uL (ref 0.1–1.0)
Monocytes Relative: 10.6 % (ref 3.0–12.0)
Neutro Abs: 4.5 10*3/uL (ref 1.4–7.7)
Neutrophils Relative %: 66.4 % (ref 43.0–77.0)
Platelets: 262 10*3/uL (ref 150.0–400.0)
RBC: 4.82 Mil/uL (ref 3.87–5.11)
RDW: 13.1 % (ref 11.5–15.5)
WBC: 6.8 10*3/uL (ref 4.0–10.5)

## 2017-07-04 LAB — COMPREHENSIVE METABOLIC PANEL
ALBUMIN: 4.2 g/dL (ref 3.5–5.2)
ALT: 19 U/L (ref 0–35)
AST: 17 U/L (ref 0–37)
Alkaline Phosphatase: 75 U/L (ref 39–117)
BUN: 17 mg/dL (ref 6–23)
CALCIUM: 9.8 mg/dL (ref 8.4–10.5)
CHLORIDE: 106 meq/L (ref 96–112)
CO2: 29 mEq/L (ref 19–32)
CREATININE: 0.88 mg/dL (ref 0.40–1.20)
GFR: 73.11 mL/min (ref >60.00)
Glucose, Bld: 110 mg/dL — ABNORMAL HIGH (ref 70–99)
Potassium: 4.3 mEq/L (ref 3.5–5.1)
SODIUM: 141 meq/L (ref 135–145)
TOTAL PROTEIN: 7.1 g/dL (ref 6.0–8.3)
Total Bilirubin: 0.4 mg/dL (ref 0.2–1.2)

## 2017-07-04 LAB — LIPID PANEL
CHOLESTEROL: 178 mg/dL (ref 0–200)
HDL: 53.1 mg/dL (ref >39.00)
LDL CALC: 96 mg/dL (ref 0–99)
NonHDL: 124.81
TRIGLYCERIDES: 142 mg/dL (ref 0.0–149.0)
Total CHOL/HDL Ratio: 3
VLDL: 28.4 mg/dL (ref 0.0–40.0)

## 2017-07-04 LAB — TSH: TSH: 0.84 u[IU]/mL (ref 0.35–4.50)

## 2017-07-04 LAB — T4, FREE: FREE T4: 0.91 ng/dL (ref 0.60–1.60)

## 2017-07-04 NOTE — Progress Notes (Signed)
46 yo pleasant female here for CPX and follow up of chronic medical conditions.  Well woman- no h/o abnormal pap smears. 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  Clobetasol cream prn does help with the itching- uses infrequently- sometimes once a month.  Last pap smear 06/30/15 (done by me). No family h/o cervical, uterine or breast CA.  Mammogram 07/01/17  Lab Results  Component Value Date   CHOL 183 07/03/2016   HDL 53.40 07/03/2016   LDLCALC 92 07/03/2016   LDLDIRECT 137.3 05/25/2011   TRIG 186.0 (H) 07/03/2016   CHOLHDL 3 07/03/2016    Thyroid nodule- Due for labs.  She denies any symptoms of hyper or hypothyroidism. Lab Results  Component Value Date   TSH 0.77 07/03/2016    Saw Dr. Biagio QuintLayton in 09/2012.  Multinodular goiter with dominant nodule biopsy was negative for malignancy.  He recommended yearly ultrasounds- last US 07/09/16 reviewed- stable US.  Lab Results  Component Value Date   WBC 8.1 07/03/2016   HGB 15.0 07/03/2016   HCT 44.0 07/03/2016   MCV 88.5 07/03/2016   PLT 277.0 07/03/2016   Lab Results  Component Value Date   NA 137 07/03/2016   K 4.0 07/03/2016   CL 104 07/03/2016   CO2 28 07/03/2016   Lab Results  Component Value Date   ALT 19 07/03/2016   AST 18 07/03/2016   ALKPHOS 77 07/03/2016   BILITOT 0.5 07/03/2016    Wt Readings from Last 3 Encounters:  07/04/17 199 lb (90.3 kg)  07/03/16 199 lb 8 oz (90.5 kg)  06/30/15 190 lb 8 oz (86.4 kg)    Patient Active Problem List   Diagnosis Date Noted  . Multiple thyroid nodules 08/27/2014  . Overweight (BMI 25.0-29.9) 05/29/2012  . Routine general medical examination at a health care facility 05/29/2011  . Allergic rhinitis 04/11/2007  . 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 Allergy    Past Surgical History:  Procedure Laterality Date  . TONGUE BIOPSY  02/22/2009   oral surgeon  . 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 Diabetes Brother   . Obesity Brother   . Cancer Paternal Grandfather        bone  . Breast cancer Neg Hx    No Known Allergies Current Outpatient Prescriptions on File Prior to Visit  Medication Sig Dispense Refill  . clobetasol cream (TEMOVATE) 0.05 % Apply topically 2 (two) times daily. 30 g 5  . naproxen sodium (ANAPROX) 220 MG tablet OTC as directed      No current facility-administered medications on file prior to visit.        Review of Systems  Constitutional: Negative.   HENT: Negative.   Eyes: Negative.   Respiratory: Negative.   Cardiovascular: Negative.   Gastrointestinal: Negative.   Endocrine: Negative.   Genitourinary: Negative.   Musculoskeletal: Negative.   Skin: Negative.   Allergic/Immunologic: Negative.   Neurological: Negative.   Hematological: Negative.   Psychiatric/Behavioral: Negative.       Physical Exam BP 120/88   Pulse 92   Ht 5' 6.5" (1.689 m)   Wt 199 lb (90.3 kg)   LMP 06/30/2017   SpO2 96%   BMI 31.64 kg/m    General:  Well-developed,well-nourished,in no acute distress; alert,appropriate and cooperative throughout examination Head:  normocephalic and atraumatic.   Eyes:  vision grossly intact, PERRL Ears:  R ear  normal and L ear normal externally, TMs clear bilaterally Nose:  no external deformity.   Mouth:  good dentition.   Neck:  No deformities, masses, or tenderness noted. Breasts:  No mass, nodules, thickening, tenderness, bulging, retraction, inflamation, nipple discharge or skin changes noted.   Lungs:  Normal respiratory effort, chest expands symmetrically. Lungs are clear to auscultation, no crackles or wheezes. Heart:  Normal rate and regular rhythm. S1 and S2 normal without gallop, murmur, click, rub or other extra sounds. Abdomen:  Bowel sounds positive,abdomen soft and non-tender without masses, organomegaly or hernias noted. Rectal:  no external abnormalities.   Genitalia:   Pelvic Exam:        External: normal female genitalia without lesions or masses        Vagina: normal without lesions or masses        Cervix: normal without lesions or masses        Adnexa: normal bimanual exam without masses or fullness        Uterus: normal by palpation        Pap smear: performed Msk:  No deformity or scoliosis noted of thoracic or lumbar spine.   Extremities:  No clubbing, cyanosis, edema, or deformity noted with normal full range of motion of all joints.   Neurologic:  alert & oriented X3 and gait normal.   Skin:  Intact without suspicious lesions or rashes Cervical Nodes:  No lymphadenopathy noted Axillary Nodes:  No palpable lymphadenopathy Psych:  Cognition and judgment appear intact. Alert and cooperative with normal attention span and concentration. No apparent delusions, illusions, hallucinations

## 2017-07-04 NOTE — Addendum Note (Signed)
Addended by: Gregery NaVALENCIA, Denard Tuminello P on: 07/04/2017 08:22 AM   Modules accepted: Orders

## 2017-07-04 NOTE — Patient Instructions (Signed)
Great to see you. We will call you with your results from today or you can view them online.

## 2017-07-04 NOTE — Assessment & Plan Note (Signed)
Labs today, pt to schedule yearly US.

## 2017-07-04 NOTE — Assessment & Plan Note (Signed)
Reviewed preventive care protocols, scheduled due services, and updated immunizations Discussed nutrition, exercise, diet, and healthy lifestyle.  Pap smear done today.  Orders Placed This Encounter  Procedures  . TSH  . T4, Free  . CBC with Differential/Platelet  . Comprehensive metabolic panel  . Lipid panel

## 2017-07-09 LAB — CYTOLOGY - PAP: HPV (WINDOPATH): NOT DETECTED

## 2017-07-10 ENCOUNTER — Other Ambulatory Visit: Payer: Self-pay | Admitting: Family Medicine

## 2017-07-10 DIAGNOSIS — E041 Nontoxic single thyroid nodule: Secondary | ICD-10-CM

## 2017-07-10 DIAGNOSIS — R87619 Unspecified abnormal cytological findings in specimens from cervix uteri: Secondary | ICD-10-CM

## 2017-07-17 ENCOUNTER — Ambulatory Visit
Admission: RE | Admit: 2017-07-17 | Discharge: 2017-07-17 | Disposition: A | Discharge status: Home / Self Care | Payer: BC Managed Care – PPO | Source: Ambulatory Visit | Attending: Family Medicine | Admitting: Family Medicine

## 2017-07-17 DIAGNOSIS — E041 Nontoxic single thyroid nodule: Secondary | ICD-10-CM

## 2017-10-03 ENCOUNTER — Ambulatory Visit (INDEPENDENT_AMBULATORY_CARE_PROVIDER_SITE_OTHER): Payer: BC Managed Care – PPO | Admitting: Podiatry

## 2017-10-03 ENCOUNTER — Other Ambulatory Visit: Payer: Self-pay | Admitting: Podiatry

## 2017-10-03 ENCOUNTER — Ambulatory Visit (INDEPENDENT_AMBULATORY_CARE_PROVIDER_SITE_OTHER): Payer: BC Managed Care – PPO

## 2017-10-03 ENCOUNTER — Encounter: Payer: Self-pay | Admitting: Podiatry

## 2017-10-03 VITALS — BP 145/89 | HR 87 | Resp 16

## 2017-10-03 DIAGNOSIS — M722 Plantar fascial fibromatosis: Secondary | ICD-10-CM | POA: Diagnosis not present

## 2017-10-03 DIAGNOSIS — M79672 Pain in left foot: Secondary | ICD-10-CM

## 2017-10-03 DIAGNOSIS — M6740 Ganglion, unspecified site: Secondary | ICD-10-CM | POA: Diagnosis not present

## 2017-10-03 MED ORDER — TRIAMCINOLONE ACETONIDE 10 MG/ML IJ SUSP
10.0000 mg | Freq: Once | INTRAMUSCULAR | Status: AC
  Administered 2017-10-03: 10 mg

## 2017-10-03 MED ORDER — DICLOFENAC SODIUM 75 MG PO TBEC: 75.0000 mg | DELAYED_RELEASE_TABLET | Freq: Two times a day (BID) | ORAL | 2 refills | Status: DC

## 2017-10-03 NOTE — Progress Notes (Signed)
   Subjective:    Patient ID: Natalie ShengAmanda C Seedorf, female    DOB: 09/28/1971, 46 y.o.   MRN: [id number]  HPI    Review of Systems     Objective:   Physical Exam        Assessment & Plan:

## 2017-10-03 NOTE — Progress Notes (Signed)
Subjective:    Patient ID: [name], female   DOB: 46 y.o.   MRN: [id number]   HPI patient presents stating she has a lot of pain in the left heel and she also noted not in the arch which is been present for around a year and has not grown in size and she's not sure if the pain comes from the plantar fascia from the knot. Patient does not smoke    Review of Systems  All other systems reviewed and are negative.       Objective:  Physical Exam  Constitutional: She appears well-developed and well-nourished.  Cardiovascular: Intact distal pulses.   Pulmonary/Chest: Effort normal.  Musculoskeletal: Normal range of motion.  Neurological: She is alert.  Skin: Skin is warm.  Nursing note and vitals reviewed.  neurovascular status intact muscle strength adequate range of motion within normal limits with patient found to have exquisite discomfort plantar fascial left and also is noted to have a nodule proximal to this measuring about 1 cm x 1 cm and appears relatively soft with mild discomfort. Patient does have good digital perfusion and is well oriented 3     Assessment:    Plantar fasciitis left with probable acute inflammation with possibility for nodular formation or fibroma formation left that currently is not symptomatic but I want to watch carefully     Plan:    H&P conditions reviewed. We'll focus first on the plantar fasciitis and I injected the tendon 3 mg Kenalog 5 mg Xylocaine and applied fascial brace. I then went ahead and advised on heat therapy for the nodule and I want to see whether we'll shrink and we discussed possible MRI in future if it should grow in size become painful or change in color. Patient does not want to do an MRI currently and less anything were to occur and will be watched as time goes on  X-ray indicates that there is small plantar spur but no indication stress fracture or arthritis

## 2017-10-03 NOTE — Patient Instructions (Signed)

## 2017-10-17 ENCOUNTER — Ambulatory Visit: Payer: BC Managed Care – PPO | Admitting: Podiatry

## 2017-10-17 ENCOUNTER — Encounter: Payer: Self-pay | Admitting: Podiatry

## 2017-10-17 DIAGNOSIS — M722 Plantar fascial fibromatosis: Secondary | ICD-10-CM

## 2017-10-17 NOTE — Progress Notes (Signed)
Subjective:    Patient ID: Natalie ShengAmanda C Vallecillo, female   DOB: 46 y.o.   MRN: [id number]   HPI patient states doing much better with diminishment of discomfort left plantar heel    ROS      Objective:  Physical Exam neurovascular status intact with significant diminishment of discomfort plantar aspect left heel     Assessment:    Plantar fasciitis improving left with minimal discomfort     Plan:   Reviewed condition and recommended physical therapy anti-inflammatory patient still to have physical therapy and supportive shoe gear usage

## 2018-01-20 ENCOUNTER — Ambulatory Visit: Payer: BC Managed Care – PPO | Admitting: Family Medicine

## 2018-01-20 ENCOUNTER — Encounter: Payer: Self-pay | Admitting: Family Medicine

## 2018-01-20 DIAGNOSIS — N760 Acute vaginitis: Secondary | ICD-10-CM | POA: Insufficient documentation

## 2018-01-20 MED ORDER — TERCONAZOLE 0.4 % VA CREA: 1.0000 | TOPICAL_CREAM | Freq: Every day | VAGINAL | 0 refills | Status: AC

## 2018-01-20 MED ORDER — FLUCONAZOLE 150 MG PO TABS: ORAL_TABLET | ORAL | 0 refills | Status: DC

## 2018-01-20 MED ORDER — CLOBETASOL PROPIONATE 0.05 % EX CREA: TOPICAL_CREAM | Freq: Two times a day (BID) | CUTANEOUS | 5 refills | Status: AC

## 2018-01-20 NOTE — Progress Notes (Signed)
SUBJECTIVE:  47 y.o. female complains of external vaginal itching, burning, increased pain when sitting and walking since 01/17/18 (3 days). Denies abnormal vaginal discharge, bleeding or significant pelvic pain or fever. No UTI symptoms. Denies history of known exposure to STD.  Alleve has helped some.  Aspercreme did not help.  No new soaps, detergents, panty liners or vaginal lubricants. Patient's last menstrual period was 12/03/2017.  Current Outpatient Medications on File Prior to Visit  Medication Sig Dispense Refill  . naproxen sodium (ANAPROX) 220 MG tablet OTC as directed      No current facility-administered medications on file prior to visit.     No Known Allergies  Past Medical History:  Diagnosis Date  . Allergy     Past Surgical History:  Procedure Laterality Date  . TONGUE BIOPSY  02/22/2009   oral surgeon  . 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 Diabetes Brother   . Obesity Brother   . Cancer Paternal Grandfather        bone  . Breast cancer Neg Hx     Social History   Socioeconomic History  . Marital status: Married    Spouse name: Not on file  . Number of children: 2  . Years of education: Not on file  . Highest education level: Not on file  Social Needs  . Financial resource strain: Not on file  . Food insecurity - worry: Not on file  . Food insecurity - inability: Not on file  . Transportation needs - medical: Not on file  . Transportation needs - non-medical: Not on file  Occupational History  . Occupation: Engineer, drillingTeacher Assistant-Pre K and K    Employer: [employer]  Tobacco Use  . Smoking status: Never Smoker  . Smokeless tobacco: Never Used  Substance and Sexual Activity  . Alcohol use: No  . Drug use: No  . Sexual activity: Not on file  Other Topics Concern  . Not on file  Social History Narrative  . Not on file   The PMH, PSH, Social History, Family History, Medications, and  all[facility]ies [facility] and have been updated if relevant.  OBJECTIVE:  BP 126/84 (BP Location: Left Arm, Patient Position: Sitting, Cuff Size: Normal)   Pulse (!) 103   Temp 98 F (36.7 C) (Oral)   Ht 5' 6.5" (1.689 m)   Wt 203 lb (92.1 kg)   LMP 12/03/2017   SpO2 97%   BMI 32.27 kg/m   She appears well, afebrile. Abdomen: benign, soft, nontender, no masses. Pelvic Exam: VULVA: vulvar tenderness with ulcerations, vulvar edema, vulvar excoriation   ASSESSMENT:  Vaginitis   PLAN:  Likely yeast but given ulcerations, will check for HSV. Diflucan 150 mg by mouth every 72 hours x 3 doses, terazol topically. Call or return to clinic prn if these symptoms worsen or fail to improve as anticipated. The patient indicates understanding of these issues and agrees with the plan.

## 2018-01-20 NOTE — Patient Instructions (Signed)
Great to see you.  Take diflucan as directed and use topical terazol cream.  We will call you with lab results

## 2018-01-21 LAB — HSV(HERPES SIMPLEX VRS) I + II AB-IGG
HAV 1 IGG,TYPE SPECIFIC AB: <0.9 index
HSV 2 IGG,TYPE SPECIFIC AB: <0.9 index

## 2018-01-23 ENCOUNTER — Telehealth: Payer: Self-pay

## 2018-01-23 NOTE — Telephone Encounter (Signed)
Patient states that she has taken it easy today and feels like she will be staying home tomorrow as well/I told her to call me on Monday to advise how many days she needs for the work note/thx dmf

## 2018-01-23 NOTE — Telephone Encounter (Signed)
-----   Message from Dianne Dunalia M Aron, MD sent at 01/21/2018  3:24 PM EST ----- Please call pt- as we suspected, herpes test was negative.  How is she feeling?

## 2018-05-08 ENCOUNTER — Ambulatory Visit: Payer: Self-pay | Admitting: *Deleted

## 2018-05-08 NOTE — Telephone Encounter (Signed)
Pt reports menstrual bleeding "Siince the beginning of the month." States light flow first 2 weeks, heavier since 5/15. Reports clots at times; changes pad q3-4 hours. States "Very heavy this am but has since slowed." Clots present this AM. Denies any dizziness, abdominal pain or cramping. Last PAP 07/04/17 by Dr. Dayton Martes; referred to gynecologist who did biopsy;  "All negative."  No longer sees this MD. Appt made with Dr. Ashley Royalty for tomorrow. Care advise given per protocol.  Reason for Disposition . [1] Periods with > 6 soaked pads or tampons per day AND [2] last > 7 days  Answer Assessment - Initial Assessment Questions 1. AMOUNT: "Describe the bleeding that you are having."    - SPOTTING: spotting, or pinkish / brownish mucous discharge; does not fill panti-liner or pad    - MILD:  less than 1 pad / hour; less than patient's usual menstrual bleeding   - MODERATE: 1-2 pads / hour; small-medium blood clots (e.g., pea, grape, small coin)    - SEVERE: soaking 2 or more pads/hour for 2 or more hours; bleeding not contained by pads or continuous red blood from vagina; large blood clots (e.g., golf ball, large coin)      mild 2. ONSET: "When did the bleeding begin?" "Is it continuing now?"     04/22/18 3. MENSTRUAL PERIOD: "When was the last normal menstrual period?" "How is this different than your period?"     03/10/18 4. REGULARITY: "How regular are your periods?"     "Little further in between." 5. ABDOMINAL PAIN: "Do you have any pain?" "How bad is the pain?"  (e.g., Scale 1-10; mild, moderate, or severe)   - MILD (1-3): doesn't interfere with normal activities, abdomen soft and not tender to touch    - MODERATE (4-7): interferes with normal activities or awakens from sleep, tender to touch    - SEVERE (8-10): excruciating pain, doubled over, unable to do any normal activities      no 6. PREGNANCY: "Could you be pregnant?" "Are you sexually active?" "Did you recently give birth?"     no 7.  BREASTFEEDING: "Are you breastfeeding?"     no 8. HORMONES: "Are you taking any hormone medications, prescription or OTC?" (e.g., birth control pills, estrogen)     no 9. BLOOD THINNERS: "Do you take any blood thinners?" (e.g., Coumadin/warfarin, Pradaxa/dabigatran, aspirin)     no 10. CAUSE: "What do you think is causing the bleeding?" (e.g., recent gyn surgery, recent gyn procedure; known bleeding disorder, cervical cancer, polycystic ovarian disease, fibroids)         unsure 11. HEMODYNAMIC STATUS: "Are you weak or feeling lightheaded?" If so, ask: "Can you stand and walk normally?"        no 12. OTHER SYMPTOMS: "What other symptoms are you having with the bleeding?" (e.g., passed tissue, vaginal discharge, fever, menstrual-type cramps)       no  Protocols used: VAGINAL BLEEDING - ABNORMAL-A-AH

## 2018-05-09 ENCOUNTER — Ambulatory Visit: Payer: BC Managed Care – PPO | Admitting: Family Medicine

## 2018-05-09 ENCOUNTER — Encounter: Payer: Self-pay | Admitting: Family Medicine

## 2018-05-09 VITALS — BP 140/90 | HR 86 | Temp 98.0°F | Wt 203.0 lb

## 2018-05-09 DIAGNOSIS — N939 Abnormal uterine and vaginal bleeding, unspecified: Secondary | ICD-10-CM

## 2018-05-09 LAB — POCT URINE PREGNANCY: Preg Test, Ur: NEGATIVE

## 2018-05-09 LAB — TSH: TSH: 0.54 u[IU]/mL (ref 0.35–4.50)

## 2018-05-09 MED ORDER — NORETHIN ACE-ETH ESTRAD-FE 1-20 MG-MCG PO TABS: 1.0000 | ORAL_TABLET | Freq: Every day | ORAL | 3 refills | Status: DC

## 2018-05-09 NOTE — Assessment & Plan Note (Signed)
Possibly related to perimenopausal state Urine pregnancy test Negative Check TSH. Discussed treatment options including watchful waiting vs hormonal treatment. Given prolonged symptoms she would like to try hormonal treatment Rx for combo OCP sent to pharmacy.  Discussed she should see some improvement within a few days.  She may discuss with her PCP whether she wants to continue this at her f/u in July.

## 2018-05-09 NOTE — Progress Notes (Signed)
[name] - 47 y.o. female MRN [id number]  Date of birth: [birth year]  Subjective Chief Complaint  Patient presents with  . Menorrhagia    3 weeks     HPI [name] is a 47 y.o. female here today with complaint of abnormal uterine bleeding. She reports that symptoms began about 3 weeks ago.   Initially started out as light bleeding, then became heavy for several days.  This once again became a little lighter last week before becoming much heavier again this week.  She is currently soaking a heavy pad every 2-3 hours.  She does have some occasional clots.  She denies any dizziness or lightheaded feeling.  Her menstrual cycles up until this point have been fairly regular, did have one instance last year where she went two months without a period.  She has not noticed any vaginal discharge.    She did take OCP previously and did well with this.  Denies history of clots and negative smoking history.     No Known Allergies  Past Medical History:  Diagnosis Date  . Allergy     Past Surgical History:  Procedure Laterality Date  . TONGUE BIOPSY  02/22/2009   oral surgeon  . 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 Marital status: Married    Spouse name: Not on file  . Number of children: 2  . Years of education: Not on file  . Highest education level: Not on file  Occupational History  . 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 Financial resource strain: Not on file  . Food insecurity:    Worry: Not on file    Inability: Not on file  . Transportation needs:    Medical: Not on file    Non-medical: Not on file  Tobacco Use  . Smoking status: Never Smoker  . Smokeless tobacco: Never Used  Substance and Sexual Activity  . Alcohol use: No  . Drug use: No  . Sexual activity: Not on file  Lifestyle  . Physical activity:    Days per week: Not on file    Minutes per session: Not on file    . Stress: Not on file  Relationships  . Social connections:    Talks on phone: Not on file    Gets together: Not on file    Attends religious service: Not on file    Active member of club or organization: Not on file    Attends meetings of clubs or organizations: Not on file    Relationship status: Not on file  Other Topics Concern  . Not on file  Social History Narrative  . Not on file    Family History  Problem Relation Age of Onset  . Diabetes Brother   . Obesity Brother   . Cancer Paternal Grandfather        bone  . Breast cancer Neg Hx     Health Maintenance  Topic Date Due  . INFLUENZA VACCINE  09/22/2018 (Originally 07/10/2018)  . HIV Screening  06/16/2019 (Originally 03/16/1985)  . MAMMOGRAM  07/01/2018  . PAP SMEAR  07/04/2020  . TETANUS/TDAP  06/29/2025    ----------------------------------------------------------------------------------------------------------------------------------------------------------------------------------------------------------------- Physical Exam BP 140/90   Pulse 86   Temp 98 F (36.7 C) (Oral)   Wt 203 lb (92.1 kg)   BMI 32.27 kg/m   Physical Exam  Constitutional: She is oriented  to person, place, and time. She appears well-nourished. No distress.  HENT:  Head: Normocephalic and atraumatic.  Neck: Neck supple. No thyromegaly present.  Cardiovascular: Normal rate, regular rhythm and normal heart sounds.  Pulmonary/Chest: Effort normal and breath sounds normal.  Genitourinary:  Genitourinary Comments: Deferred per patient  Neurological: She is alert and oriented to person, place, and time.  Skin: Skin is warm and dry.  Psychiatric: She has a normal mood and affect. Her behavior is normal.    ------------------------------------------------------------------------------------------------------------------------------------------------------------------------------------------------------------------- Assessment and  Plan  Abnormal uterine bleeding Possibly related to perimenopausal state Urine pregnancy test Negative Check TSH. Discussed treatment options including watchful waiting vs hormonal treatment. Given prolonged symptoms she would like to try hormonal treatment Rx for combo OCP sent to pharmacy.  Discussed she should see some improvement within a few days.  She may discuss with her PCP whether she wants to continue this at her f/u in July.

## 2018-05-09 NOTE — Patient Instructions (Signed)

## 2018-05-12 ENCOUNTER — Telehealth: Payer: Self-pay | Admitting: Family Medicine

## 2018-05-12 NOTE — Telephone Encounter (Deleted)
Copied from CRM #[id number]. Topic: Quick Communication - Lab Results °>> May 12, 2018  1:46 PM Holsey, Torrie V, RMA wrote: °Called patient to inform them of  lab results. When patient returns call, triage nurse may disclose results. °

## 2018-05-12 NOTE — Progress Notes (Signed)
Normal thyroid function.  Please ask if she has had improvement in bleeding yet.

## 2018-05-12 NOTE — Telephone Encounter (Signed)
Copied from CRM [phone]#[id number]. Topic: Quick Communication - Lab Results >> May 12, 2018  1:46 PM Dominic PeaHolsey, Torrie V, RMA wrote: Called patient to inform them of  lab results. When patient returns call, triage nurse may disclose results.

## 2018-05-12 NOTE — Progress Notes (Signed)
Normal thyroid function.  Please ask if she has had improvement in bleeding yet.

## 2018-05-14 NOTE — Telephone Encounter (Signed)
Left message to call back on home number- cell number was disconnected when ringing.

## 2018-05-14 NOTE — Progress Notes (Signed)
Continue with medication, bleeding should continue to lessen with this.  If not improving as expected I will place a referral to GYN.

## 2018-05-26 ENCOUNTER — Telehealth: Payer: Self-pay | Admitting: Family Medicine

## 2018-05-26 NOTE — Telephone Encounter (Signed)
TA-Can you please read the original message from pt and let me know what I should tell her/plz advise/thx dmf

## 2018-05-26 NOTE — Telephone Encounter (Signed)
Left message to call back regarding bleeding.

## 2018-05-26 NOTE — Telephone Encounter (Signed)
Yes it can take a couple of months to regulate your cycle once you start birth control.  I would give it more time.  Keep us updated.

## 2018-05-26 NOTE — Telephone Encounter (Signed)
Copied from CRM [id number]. Topic: Quick Communication - See Telephone Encounter >> May 26, 2018  8:48 AM Mare LoanBurton, Donna F wrote: Pt just started taking Junel birth control and has not stopped spotting but about two or three days but then comes back and she is not sure she needs to give Junel a longer time to work or get something different   Best number [phone]

## 2018-05-27 NOTE — Telephone Encounter (Signed)
Pt is wanting a call back regarding bleeding she stated it has been going on for 6weeks now and would like to speak with someone over the phone. Callback #[phone] or [phone]

## 2018-05-27 NOTE — Telephone Encounter (Signed)
Left voicemail for pt to return my call 

## 2018-06-04 NOTE — Telephone Encounter (Signed)
TA-I spoke with pt/she stopped bleeding yesterday/She is going on vacation for 2 weeks and I asked that if it started back then to please keep track of it and also to call and I will get her an appt to be seen when she returns from vacation/she agrees/thx dmf

## 2018-06-04 NOTE — Telephone Encounter (Signed)
That sounds like a good plan. Thank you!  

## 2018-06-05 ENCOUNTER — Other Ambulatory Visit: Payer: Self-pay | Admitting: Family Medicine

## 2018-06-05 DIAGNOSIS — Z1231 Encounter for screening mammogram for malignant neoplasm of breast: Secondary | ICD-10-CM

## 2018-07-04 ENCOUNTER — Ambulatory Visit
Admission: RE | Admit: 2018-07-04 | Discharge: 2018-07-04 | Disposition: A | Discharge status: Home / Self Care | Payer: BC Managed Care – PPO | Source: Ambulatory Visit | Attending: Family Medicine | Admitting: Family Medicine

## 2018-07-04 DIAGNOSIS — Z1231 Encounter for screening mammogram for malignant neoplasm of breast: Secondary | ICD-10-CM

## 2018-07-08 ENCOUNTER — Other Ambulatory Visit: Payer: Self-pay | Admitting: Family Medicine

## 2018-07-08 ENCOUNTER — Ambulatory Visit (INDEPENDENT_AMBULATORY_CARE_PROVIDER_SITE_OTHER): Payer: BC Managed Care – PPO | Admitting: Family Medicine

## 2018-07-08 ENCOUNTER — Other Ambulatory Visit (HOSPITAL_COMMUNITY)
Admission: RE | Admit: 2018-07-08 | Discharge: 2018-07-08 | Disposition: A | Discharge status: Home / Self Care | Payer: BC Managed Care – PPO | Source: Ambulatory Visit | Attending: Family Medicine | Admitting: Family Medicine

## 2018-07-08 ENCOUNTER — Encounter: Payer: Self-pay | Admitting: Family Medicine

## 2018-07-08 VITALS — BP 136/86 | HR 96 | Temp 98.5°F | Ht 66.75 in | Wt 196.6 lb

## 2018-07-08 DIAGNOSIS — Z0000 Encounter for general adult medical examination without abnormal findings: Secondary | ICD-10-CM

## 2018-07-08 DIAGNOSIS — Z01419 Encounter for gynecological examination (general) (routine) without abnormal findings: Secondary | ICD-10-CM | POA: Insufficient documentation

## 2018-07-08 DIAGNOSIS — N898 Other specified noninflammatory disorders of vagina: Secondary | ICD-10-CM | POA: Insufficient documentation

## 2018-07-08 DIAGNOSIS — N939 Abnormal uterine and vaginal bleeding, unspecified: Secondary | ICD-10-CM

## 2018-07-08 DIAGNOSIS — E042 Nontoxic multinodular goiter: Secondary | ICD-10-CM

## 2018-07-08 DIAGNOSIS — R928 Other abnormal and inconclusive findings on diagnostic imaging of breast: Secondary | ICD-10-CM | POA: Diagnosis not present

## 2018-07-08 LAB — CBC WITH DIFFERENTIAL/PLATELET
BASOS PCT: 1 % (ref 0.0–3.0)
Basophils Absolute: 0.1 10*3/uL (ref 0.0–0.1)
EOS PCT: 1.3 % (ref 0.0–5.0)
Eosinophils Absolute: 0.1 10*3/uL (ref 0.0–0.7)
HCT: 44 % (ref 36.0–46.0)
Hemoglobin: 14.9 g/dL (ref 12.0–15.0)
Lymphocytes Relative: 22.7 % (ref 12.0–46.0)
Lymphs Abs: 1.6 10*3/uL (ref 0.7–4.0)
MCHC: 33.9 g/dL (ref 30.0–36.0)
MCV: 89.3 fl (ref 78.0–100.0)
MONOS PCT: 7.7 % (ref 3.0–12.0)
Monocytes Absolute: 0.5 10*3/uL (ref 0.1–1.0)
NEUTROS ABS: 4.7 10*3/uL (ref 1.4–7.7)
Neutrophils Relative %: 67.3 % (ref 43.0–77.0)
PLATELETS: 260 10*3/uL (ref 150.0–400.0)
RBC: 4.93 Mil/uL (ref 3.87–5.11)
RDW: 13 % (ref 11.5–15.5)
WBC: 7 10*3/uL (ref 4.0–10.5)

## 2018-07-08 LAB — COMPREHENSIVE METABOLIC PANEL
ALT: 19 U/L (ref 0–35)
AST: 13 U/L (ref 0–37)
Albumin: 4.1 g/dL (ref 3.5–5.2)
Alkaline Phosphatase: 68 U/L (ref 39–117)
BUN: 15 mg/dL (ref 6–23)
CHLORIDE: 104 meq/L (ref 96–112)
CO2: 27 meq/L (ref 19–32)
CREATININE: 0.87 mg/dL (ref 0.40–1.20)
Calcium: 9.2 mg/dL (ref 8.4–10.5)
GFR: 73.76 mL/min (ref >60.00)
Glucose, Bld: 105 mg/dL — ABNORMAL HIGH (ref 70–99)
Potassium: 3.9 mEq/L (ref 3.5–5.1)
Sodium: 138 mEq/L (ref 135–145)
Total Bilirubin: 0.5 mg/dL (ref 0.2–1.2)
Total Protein: 6.9 g/dL (ref 6.0–8.3)

## 2018-07-08 LAB — LIPID PANEL
CHOL/HDL RATIO: 4
Cholesterol: 174 mg/dL (ref 0–200)
HDL: 48.9 mg/dL (ref >39.00)
LDL CALC: 87 mg/dL (ref 0–99)
NonHDL: 124.98
Triglycerides: 190 mg/dL — ABNORMAL HIGH (ref 0.0–149.0)
VLDL: 38 mg/dL (ref 0.0–40.0)

## 2018-07-08 MED ORDER — FLUCONAZOLE 150 MG PO TABS: 150.0000 mg | ORAL_TABLET | Freq: Once | ORAL | 0 refills | Status: AC

## 2018-07-08 NOTE — Addendum Note (Signed)
Addended by: Dianne DunARON, TALIA M on: 07/08/2018 08:01 AM   Modules accepted: Orders

## 2018-07-08 NOTE — Assessment & Plan Note (Signed)
Patient to call to schedule additional views.  Orders already placed.

## 2018-07-08 NOTE — Assessment & Plan Note (Signed)
Improved with OCPs

## 2018-07-08 NOTE — Assessment & Plan Note (Addendum)
Itchy and seems consistent with previous yeast infections.  Wet prep sent with pap smear and will send diflucan 150 mg po x 1. Declines STD testing.

## 2018-07-08 NOTE — Assessment & Plan Note (Signed)
Reviewed preventive care protocols, scheduled due services, and updated immunizations Discussed nutrition, exercise, diet, and healthy lifestyle.  Orders Placed This Encounter  Procedures  . MM Digital Diagnostic Bilat  . US BREAST COMPLETE UNI RIGHT INC AXILLA  . US BREAST COMPLETE UNI LEFT INC AXILLA  . Lipid panel  . Comprehensive metabolic panel  . CBC with Differential/Platelet

## 2018-07-08 NOTE — Patient Instructions (Addendum)
Great to see you. I will call you with your lab results from today and you can view them online.   Please call the breast center at [phone] to schedule your mammogram.  

## 2018-07-08 NOTE — Progress Notes (Addendum)
Subjective:   Patient ID: [name], female    DOB: 1971-03-25, 47 y.o.   MRN: [id number]  [name] is a pleasant 47 y.o. year old female who presents to clinic today with Annual Exam (Patient is here today for a CPE with PAP.  Mammogram completed Friday. Last PAP on 7.26.18 which showed Atypical Glandular Cells and referred to [facility].  They performed a Colpo and confirmed no malignancy and referred back to PCP for PAP's.  On 5.31.19 pt saw Dr. Ashley Royalty for abn uterine bleeding and was started on Junel.  Today she is having vaginal discharge and itching.  She is currently fasting.)  on 07/08/2018  HPI:  Health Maintenance  Topic Date Due  . INFLUENZA VACCINE  09/22/2018 (Originally 07/10/2018)  . HIV Screening  06/16/2019 (Originally [birth year]1985)  . MAMMOGRAM  07/05/2019  . PAP SMEAR  07/04/2020  . TETANUS/TDAP  06/29/2025   Last pap smear done by me on 07/04/17-atypical granular cells.  Referred to GYN- colposcopy neg and referred by to me. Having itchy vaginal discharge, consistent with typical yeast infections.  Has not tried anything OTC yet.  Mammogram done yesterday- advised additional imaging.  Saw Dr. Ashley Royalty on 05/09/18 for DUB- note reviewed. Felt likely due to perimenopause and placed on OCPs.  Bleeding has finally improved.  Lab Results  Component Value Date   WBC 6.8 07/04/2017   HGB 14.7 07/04/2017   HCT 43.4 07/04/2017   MCV 90.1 07/04/2017   PLT 262.0 07/04/2017    Lab Results  Component Value Date   TSH 0.54 05/09/2018   Lab Results  Component Value Date   CHOL 178 07/04/2017   HDL 53.10 07/04/2017   LDLCALC 96 07/04/2017   LDLDIRECT 137.3 05/25/2011   TRIG 142.0 07/04/2017   CHOLHDL 3 07/04/2017    Current Outpatient Medications on File Prior to Visit  Medication Sig Dispense Refill  . clobetasol cream (TEMOVATE) 0.05 % Apply topically 2 (two) times daily. 30 g 5  . naproxen sodium (ANAPROX) 220 MG tablet OTC as directed     .  norethindrone-ethinyl estradiol (JUNEL FE 1/20) 1-20 MG-MCG tablet Take 1 tablet by mouth daily. 1 Package 3  . terconazole (TERAZOL 7) 0.4 % vaginal cream Place 1 applicator vaginally at bedtime. 45 g 0   No current facility-administered medications on file prior to visit.     No Known Allergies  Past Medical History:  Diagnosis Date  . Allergy     Past Surgical History:  Procedure Laterality Date  . TONGUE BIOPSY  02/22/2009   oral surgeon  . 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 Diabetes Brother   . Obesity Brother   . Cancer Paternal Grandfather        bone  . Breast cancer Neg Hx     Social History   Socioeconomic History  . Marital status: Married    Spouse name: Not on file  . Number of children: 2  . Years of education: Not on file  . Highest education level: Not on file  Occupational History  . 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 Financial resource strain: Not on file  . Food insecurity:    Worry: Not on file    Inability: Not on file  . Transportation needs:    Medical: Not on file    Non-medical: Not  on file  Tobacco Use  . Smoking status: Never Smoker  . Smokeless tobacco: Never Used  Substance and Sexual Activity  . Alcohol use: No  . Drug use: No  . Sexual activity: Not on file  Lifestyle  . Physical activity:    Days per week: Not on file    Minutes per session: Not on file  . Stress: Not on file  Relationships  . Social connections:    Talks on phone: Not on file    Gets together: Not on file    Attends religious service: Not on file    Active member of club or organization: Not on file    Attends meetings of clubs or organizations: Not on file    Relationship status: Not on file  . Intimate partner violence:    Fear of current or ex partner: Not on file    Emotionally abused: Not on file    Physically abused: Not on file    Forced  sexual activity: Not on file  Other Topics Concern  . Not on file  Social History Narrative  . Not on file   The PMH, PSH, Social History, Family History, Medications, and allergies have been reviewed in [facility], and have been updated if relevant.      Review of Systems  Constitutional: Negative.   HENT: Negative.   Eyes: Negative.   Respiratory: Negative.   Cardiovascular: Negative.   Gastrointestinal: Negative.   Endocrine: Negative.   Genitourinary: Positive for menstrual problem and vaginal discharge. Negative for decreased urine volume, urgency, vaginal bleeding and vaginal pain.  Musculoskeletal: Negative.   Skin: Negative.   Allergic/Immunologic: Negative.   Neurological: Negative.   Hematological: Negative.   Psychiatric/Behavioral: Negative.   All other systems reviewed and are negative.      Objective:    BP 136/86 (BP Location: Left Arm, Patient Position: Sitting, Cuff Size: Normal)   Pulse 96   Temp 98.5 F (36.9 C) (Oral)   Ht 5' 6.75" (1.695 m)   Wt 196 lb 9.6 oz (89.2 kg)   LMP 06/30/2018   SpO2 97%   BMI 31.02 kg/m    Physical Exam   General:  Well-developed,well-nourished,in no acute distress; alert,appropriate and cooperative throughout examination Head:  normocephalic and atraumatic.   Eyes:  vision grossly intact, PERRL Ears:  R ear normal and L ear normal externally, TMs clear bilaterally Nose:  no external deformity.   Mouth:  good dentition.   Neck:  No deformities, masses, or tenderness noted. Breasts:  No mass, nodules, thickening, tenderness, bulging, retraction, inflamation, nipple discharge or skin changes noted.   Lungs:  Normal respiratory effort, chest expands symmetrically. Lungs are clear to auscultation, no crackles or wheezes. Heart:  Normal rate and regular rhythm. S1 and S2 normal without gallop, murmur, click, rub or other extra sounds. Abdomen:  Bowel sounds positive,abdomen soft and non-tender without masses, organomegaly  or hernias noted. Rectal:  no external abnormalities.   Genitalia:  Pelvic Exam:        External: normal female genitalia without lesions or masses        Vagina: normal without lesions or masses        Cervix: normal without lesions or masses, + white discharge in os        Adnexa: normal bimanual exam without masses or fullness        Uterus: normal by palpation        Pap smear: performed Msk:  No deformity or  scoliosis noted of thoracic or lumbar spine.   Extremities:  No clubbing, cyanosis, edema, or deformity noted with normal full range of motion of all joints.   Neurologic:  alert & oriented X3 and gait normal.   Skin:  Intact without suspicious lesions or rashes Cervical Nodes:  No lymphadenopathy noted Axillary Nodes:  No palpable lymphadenopathy Psych:  Cognition and judgment appear intact. Alert and cooperative with normal attention span and concentration. No apparent delusions, illusions, hallucinations       Assessment & Plan:   Well woman exam with routine gynecological exam - Plan: Cytology - PAP  Abnormal uterine bleeding - Plan: CBC with Differential/Platelet  Nontoxic multinodular goiter - Plan: Lipid panel, Comprehensive metabolic panel, CBC with Differential/Platelet  Vaginal discharge - Plan: Cytology - PAP  Vaginal itching - Plan: Cytology - PAP No follow-ups on file.

## 2018-07-10 LAB — CYTOLOGY - PAP
Bacterial vaginitis: NEGATIVE
Candida vaginitis: NEGATIVE
Diagnosis: NEGATIVE
HPV: NOT DETECTED

## 2018-07-11 ENCOUNTER — Ambulatory Visit
Admission: RE | Admit: 2018-07-11 | Discharge: 2018-07-11 | Disposition: A | Discharge status: Home / Self Care | Payer: BC Managed Care – PPO | Source: Ambulatory Visit | Attending: Family Medicine | Admitting: Family Medicine

## 2018-07-11 DIAGNOSIS — R928 Other abnormal and inconclusive findings on diagnostic imaging of breast: Secondary | ICD-10-CM

## 2018-08-22 ENCOUNTER — Other Ambulatory Visit: Payer: Self-pay | Admitting: Family Medicine

## 2018-08-22 NOTE — Telephone Encounter (Signed)
Forwarding to pcp

## 2018-12-12 ENCOUNTER — Other Ambulatory Visit: Payer: Self-pay | Admitting: Family Medicine

## 2018-12-12 MED ORDER — NORETHIN ACE-ETH ESTRAD-FE 1-20 MG-MCG PO TABS: 1.0000 | ORAL_TABLET | Freq: Every day | ORAL | 3 refills | Status: AC

## 2018-12-12 NOTE — Telephone Encounter (Signed)
Copied from CRM [id number]. Topic: Quick Communication - Rx Refill/Question >> Dec 12, 2018 12:12 PM Laural Benes, [place] C wrote: Medication: JUNEL FE 1/20 1-20 MG-MCG tablet   Has the patient contacted their pharmacy? No   (Agent: If no, request that the patient contact the pharmacy for the refill.) (Agent: If yes, when and what did the pharmacy advise?)  Preferred Pharmacy (with phone number or street name): [facility] [address], [place] - [address] [phone] (Phone) [phone] (Fax)    Agent: Please be advised that RX refills may take up to 3 business days. We ask that you follow-up with your pharmacy.

## 2019-01-14 ENCOUNTER — Ambulatory Visit: Payer: Self-pay

## 2019-01-14 NOTE — Telephone Encounter (Signed)
Dr. Dayton Martes please advise, pt states that she was put on Junel to regulate her period. Pt states that May- December her period was normal but she hasn't had a period in the month of January or Feb so far. Pt wants to know if she could stop taking the contraceptive or if she should continue taking?

## 2019-01-14 NOTE — Telephone Encounter (Signed)
If it is bothering her to not get a period, she can certainly stop it to see what happens.  If she is not menopausal yet, however, she can still get pregnant if her husband has not has a vasectomy or is not using condoms.  It will take a few months to see what her body will do.  Keep [name] updated.

## 2019-01-14 NOTE — Telephone Encounter (Signed)
Patient called and says that she was put on the birth control pill Junel to regulate her periods. She says she had a period monthly in May-December, but no period in January or this month so far. She says this is the week it's supposed to come on and it hasn't started and she hasn't seen any evidence of it starting. She says she's having a few cramps, but no period. She asks can she just some off of the pills or should she continue taking them? I advised I will send this to Dr. Dayton Martes and someone will call with the recommendation.  Reason for Disposition . [1] Caller has NON-URGENT question AND [2] triager unable to answer question  Answer Assessment - Initial Assessment Questions 1. TYPE: "What is the name of the birth control pill you are using?"     Junel 2. PACK TYPE: "How do you take your birth control pill?"   - 28-Day Cycle/Pack: Takes an active hormone pill days 1-21 and placebo pill on days 21-28   - 28-Day Cycle/21-Day Pack: Takes an active hormone pill days 1-21, followed by a pill-free week   - 60-Month Cycle/Pack: Takes an active pill for 3 months, followed by pill free week or placebo week   - Continuous: Takes an active hormone pill every day, continuously    28-Day Cycle/Pack 3. INITIATION: "When did you first start taking this birth control pill?"    May, 2019 4. SYMPTOM: "What is the main symptom (or question) you're concerned about?"     No period since December, 2019 5. ONSET: "When did the symptom start?"     N/A 6. VAGINAL BLEEDING: "Are you having any unusual vaginal bleeding?"   - NONE   - SPOTTING: spotting or pinkish / brownish mucous discharge; does not fill panty-liner or pad   - MILD: less than 1 pad / hour; less than patient's usual menstrual bleeding   - MODERATE: 1-2 pads / hour; small-medium blood clots (e.g., pea, grape, small coin)   - SEVERE: soaking 2 or more pads/hour for 2 or more hours; bleeding not contained by pads or  tampons     No 7. PAIN: "Is  there any pain?" (Scale: 1-10; mild, moderate, severe).     A few cramps yesterday and this morning 8. MISSED/LATE PILLS: "Did you miss or take any pills late?" If yes, "When? How many pills?"     Not missed any pills 9. PREGNANCY: "Are you concerned you might be pregnant?" "When was your last menstrual  period?"     No; LMP December, 2019  Protocols used: CONTRACEPTION - BIRTH CONTROL PILLS - COMBINED-A-AH

## 2019-01-15 NOTE — Telephone Encounter (Signed)
Talked to pt. Pt informed me that her period actually started this morning 01/15/2019 I told her if she has any more questions or concerns please let us know

## 2019-06-08 ENCOUNTER — Other Ambulatory Visit: Payer: Self-pay | Admitting: Family Medicine

## 2019-06-08 DIAGNOSIS — Z1231 Encounter for screening mammogram for malignant neoplasm of breast: Secondary | ICD-10-CM

## 2019-07-22 ENCOUNTER — Encounter: Payer: BC Managed Care – PPO | Admitting: Family Medicine

## 2019-07-23 ENCOUNTER — Ambulatory Visit: Payer: BC Managed Care – PPO

## 2020-03-02 IMAGING — MG DIGITAL SCREENING BILATERAL MAMMOGRAM WITH CAD
4 series · 4 of 4 positions shown · non-contrast
Comparison: Previous exam(s).

CLINICAL DATA: Screening.

EXAM:
DIGITAL SCREENING BILATERAL MAMMOGRAM WITH CAD

[L MLO]
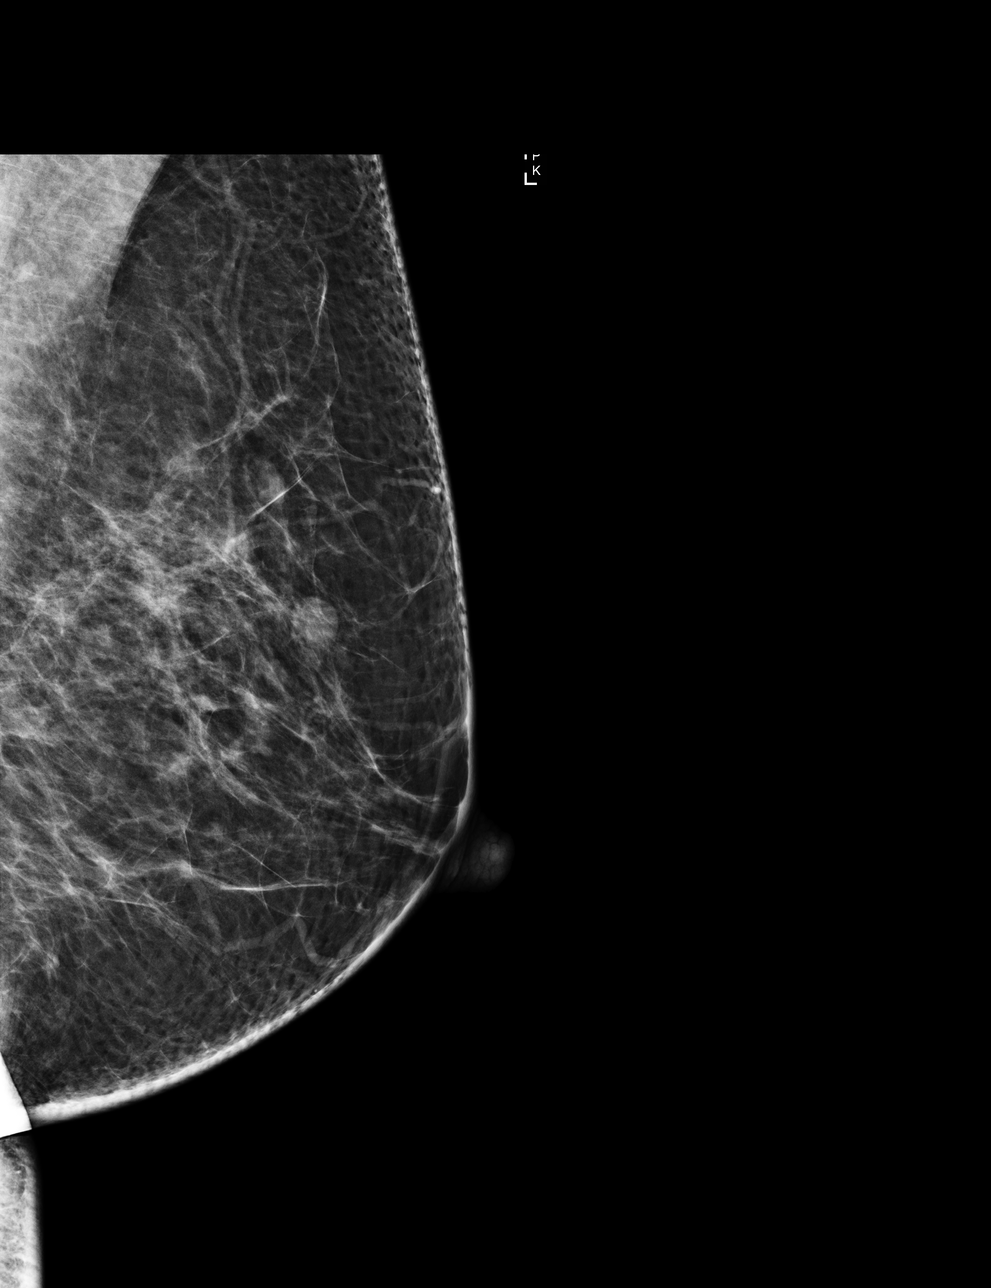

[R MLO]
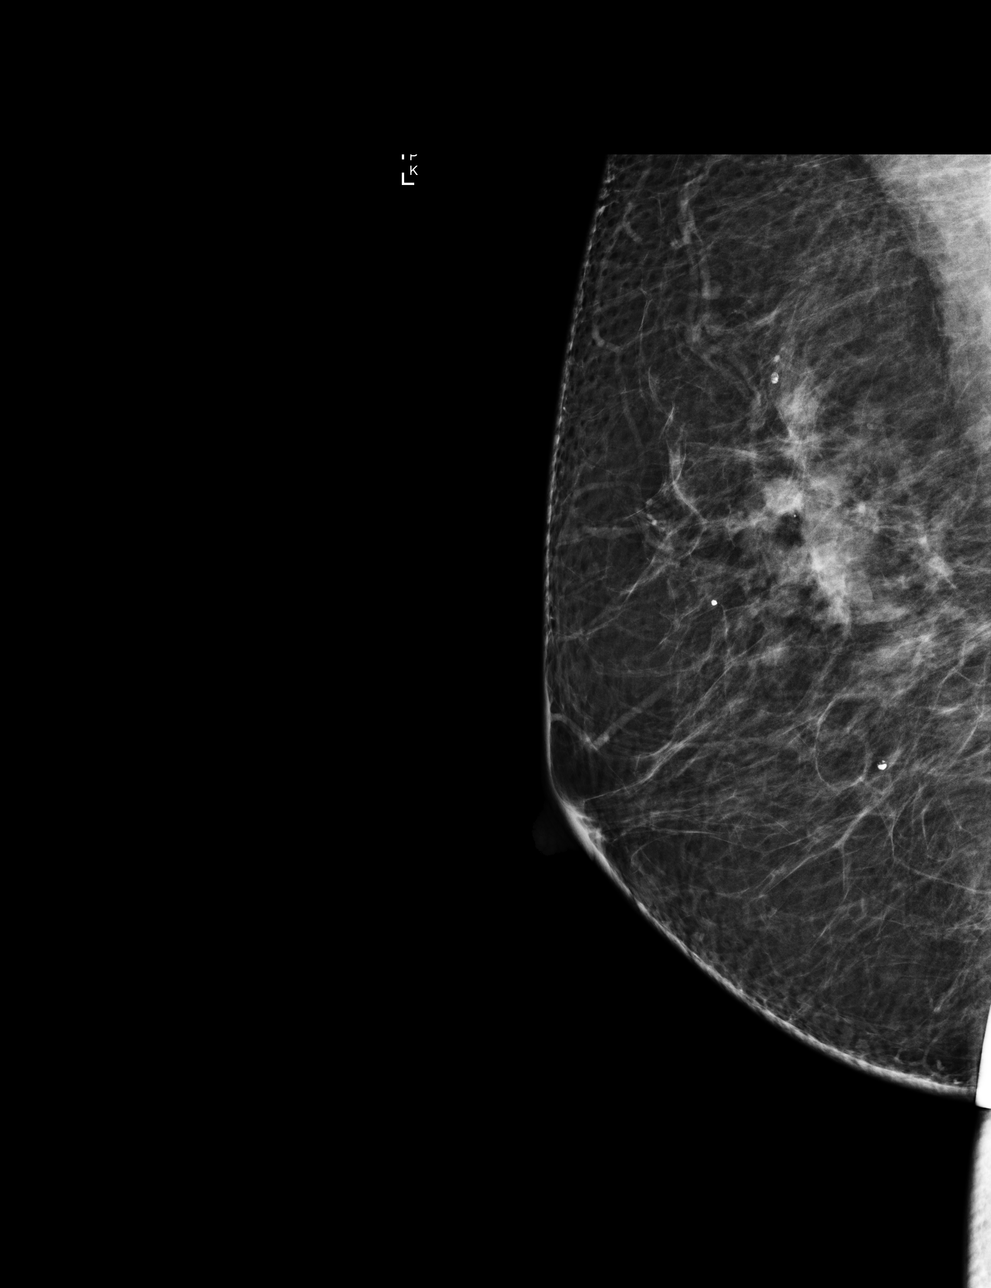

[L CC]
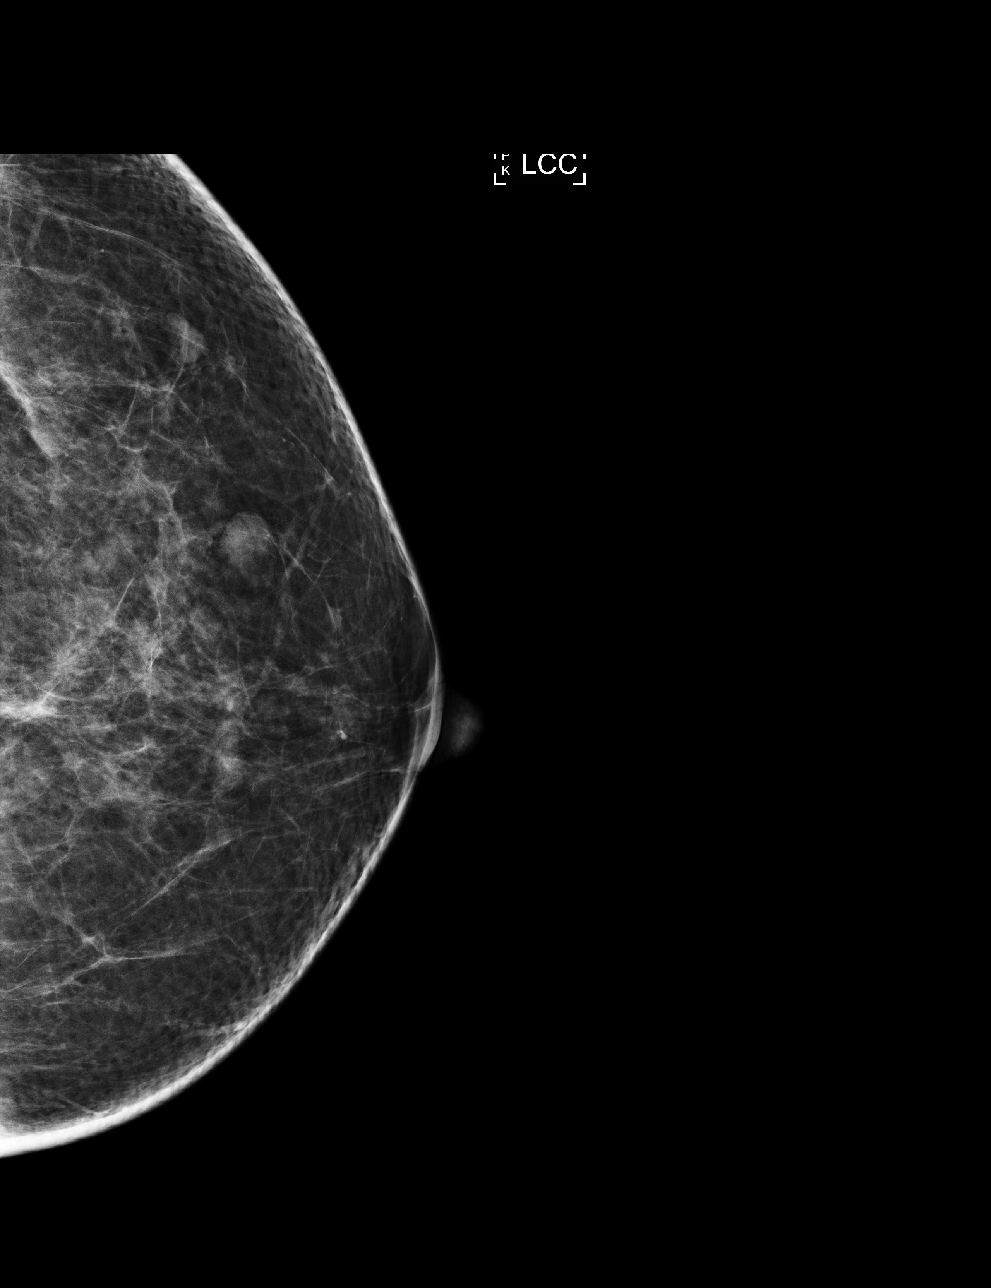

[R CC]
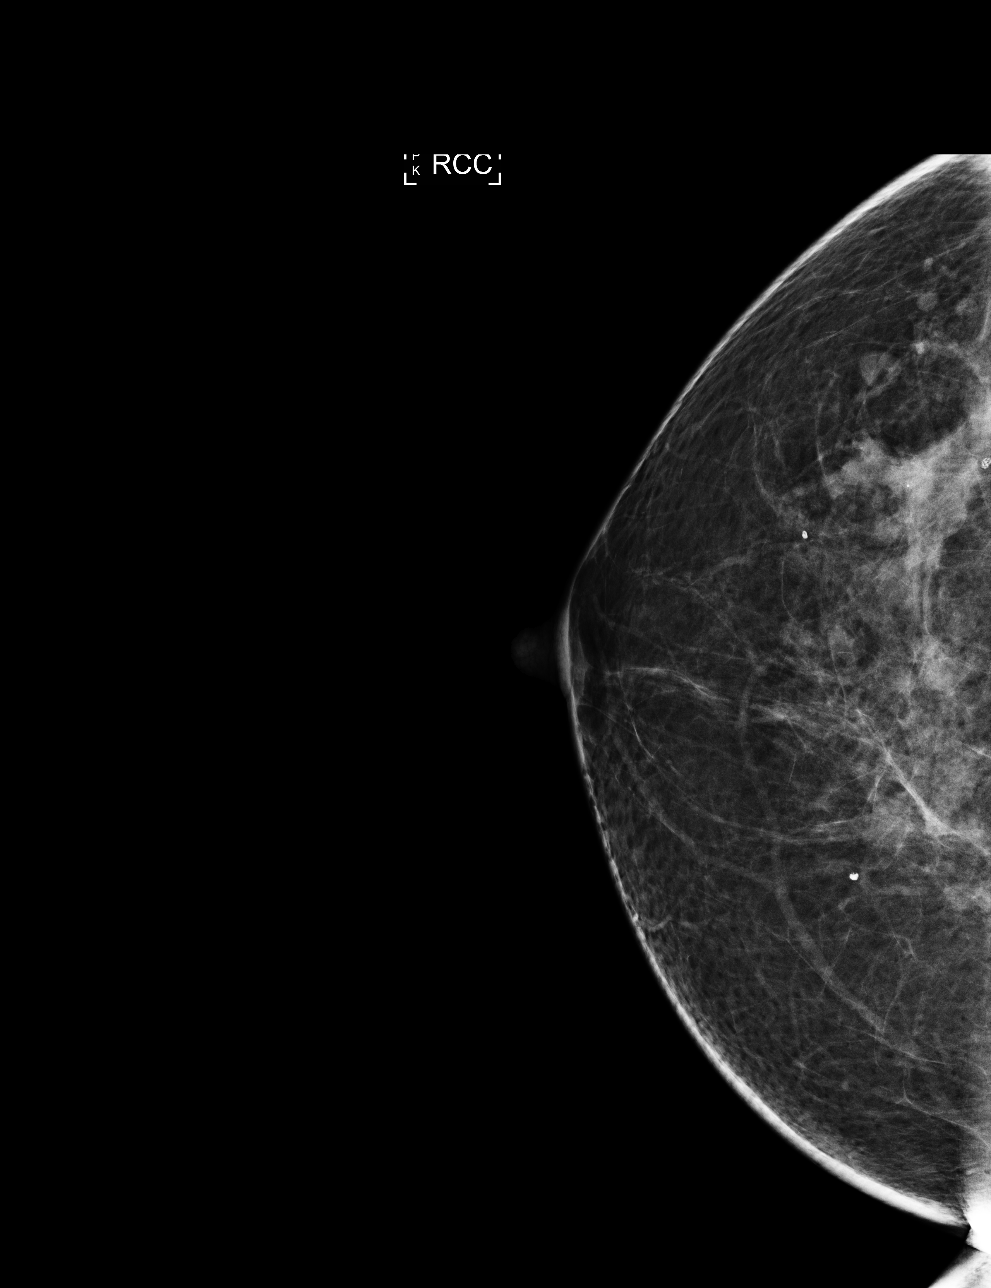

[4 of 4 positions shown; findings below may reference images not displayed]

ACR Breast Density Category c: The breast tissue is heterogeneously
dense, which may obscure small masses.
FINDINGS: In the right breast asymmetry requires further evaluation.

In the left breast mass requires further evaluation.

Images were processed with CAD.
IMPRESSION: Further evaluation is suggested for possible asymmetry in the right
breast.

Further evaluation is suggested for possible mass in the left
breast.

RECOMMENDATION:
Diagnostic mammogram and possibly ultrasound of both breasts.
(Code:SY-5-RR0)

The patient will be contacted regarding the findings, and additional
imaging will be scheduled.

BI-RADS CATEGORY  0: Incomplete. Need additional imaging evaluation
and/or prior mammograms for comparison.

## 2020-03-09 IMAGING — MG DIGITAL DIAGNOSTIC BILATERAL MAMMOGRAM WITH TOMO AND CAD
8 series · 8 of 24 positions shown · non-contrast
Comparison: Previous exam(s).

CLINICAL DATA: Screening recall for a left breast mass and a right
breast asymmetry.

EXAM:
DIGITAL DIAGNOSTIC RIGHT MAMMOGRAM WITH CAD AND TOMO
ULTRASOUND BILATERAL BREAST

[L MLO synth-2D]
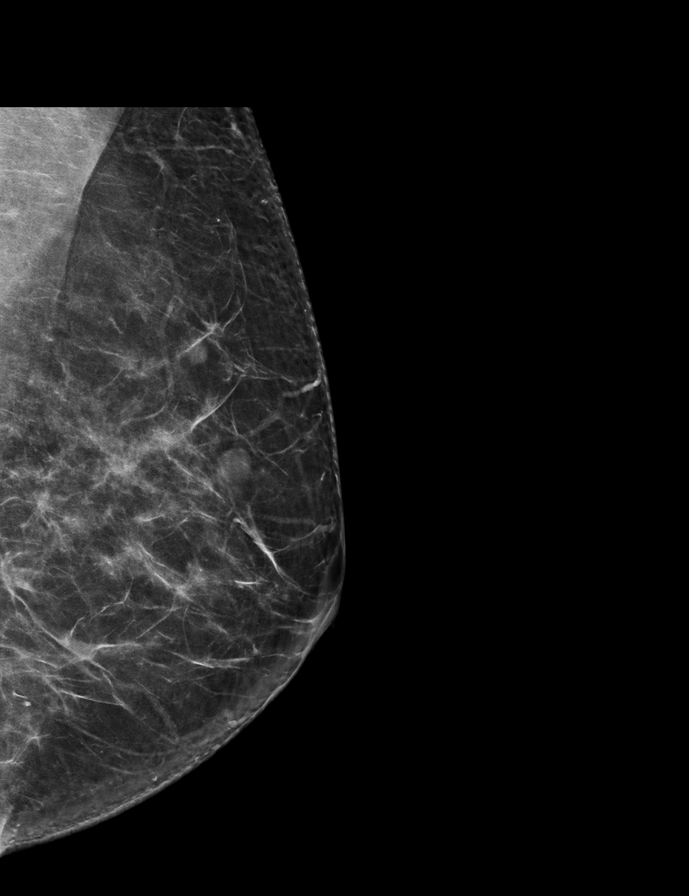

[R MLO synth-2D]
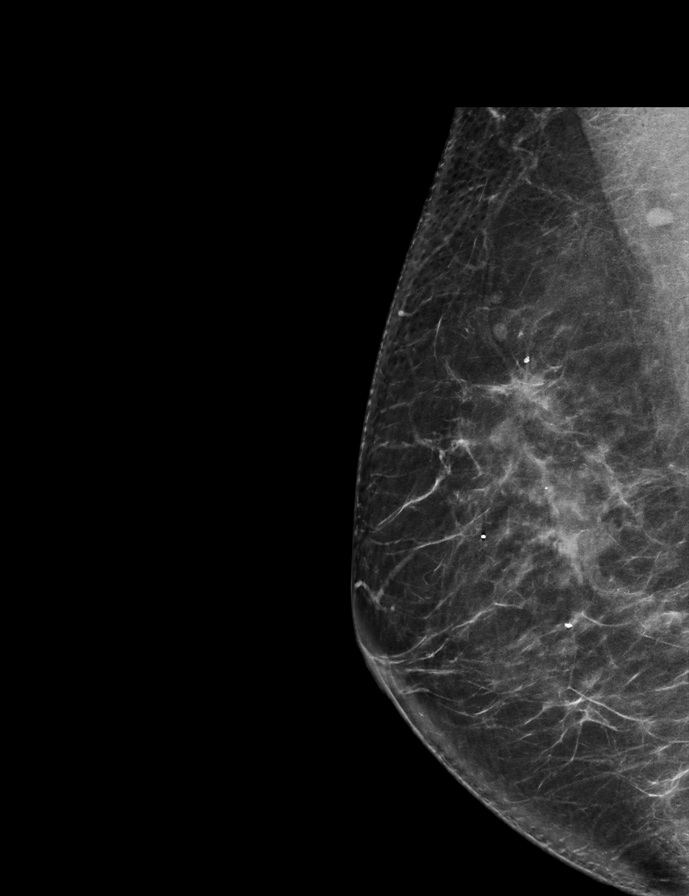

[L CC synth-2D]
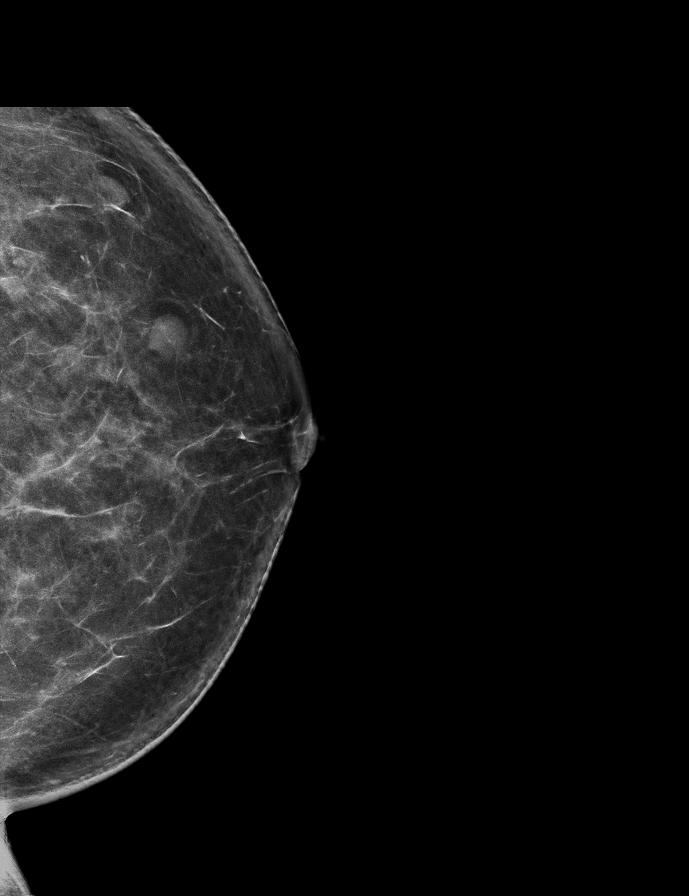

[R CC synth-2D]
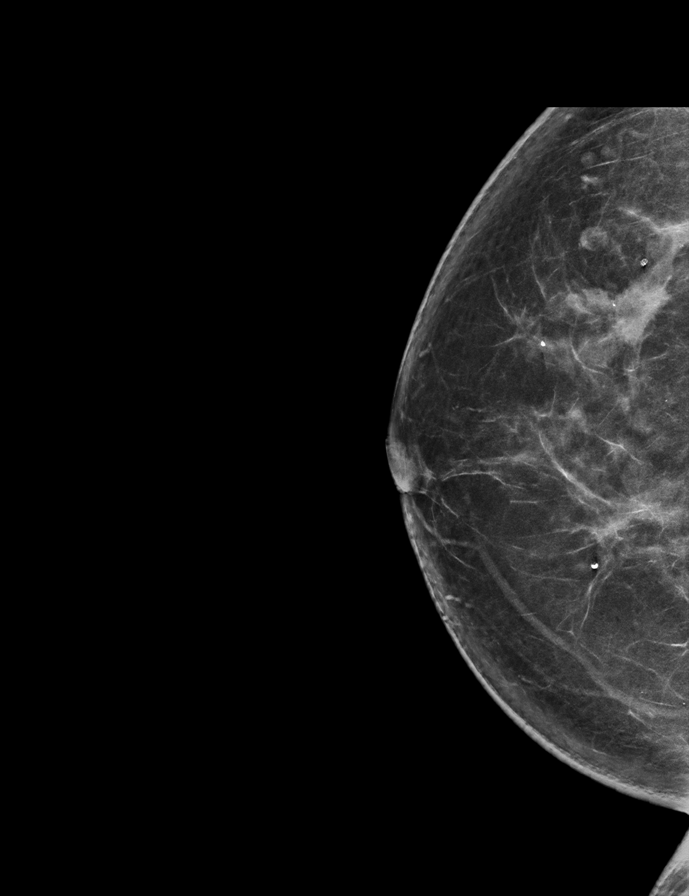

[L MLO tomo · tomo slice 38/75.0]
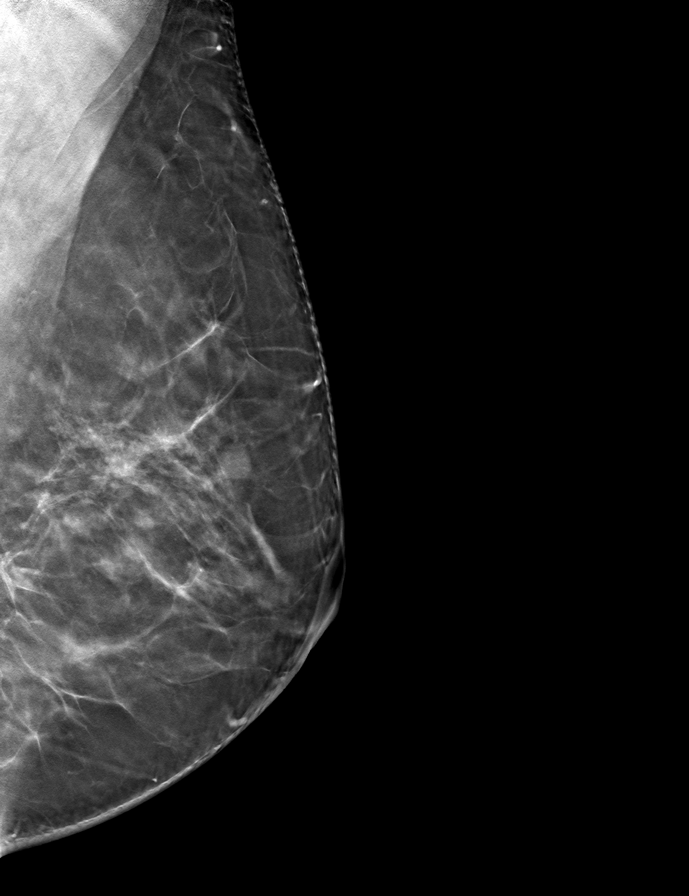

[R CC tomo · tomo slice 43/84.0]
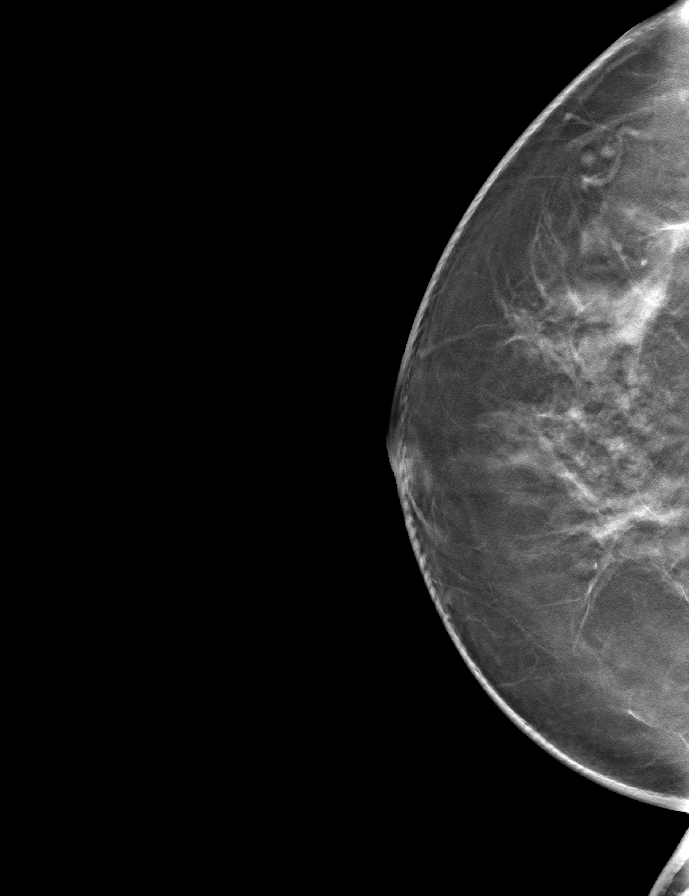

[L CC tomo · tomo slice 39/76.0]
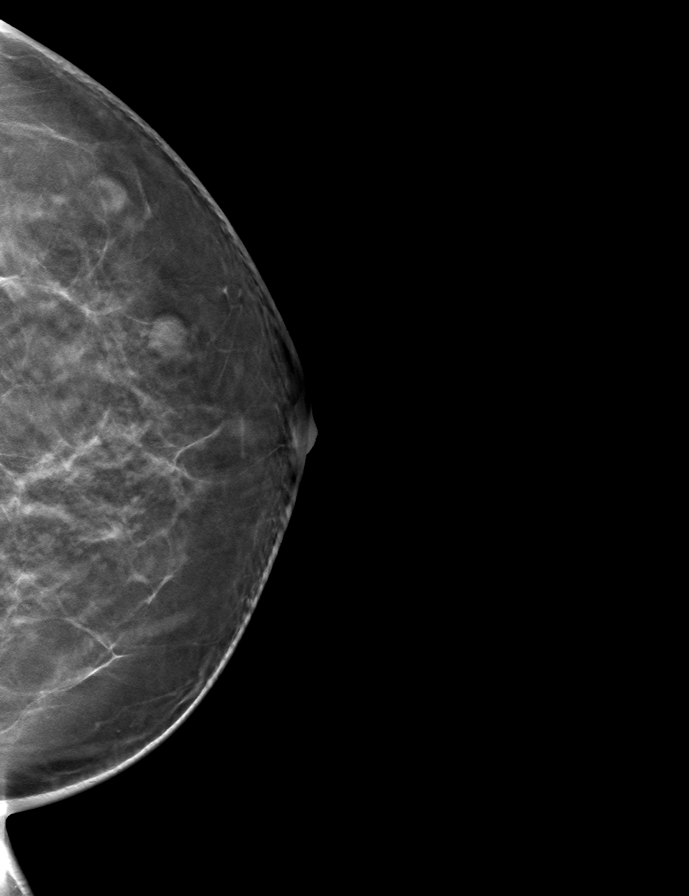

[R MLO tomo · tomo slice 40/79.0]
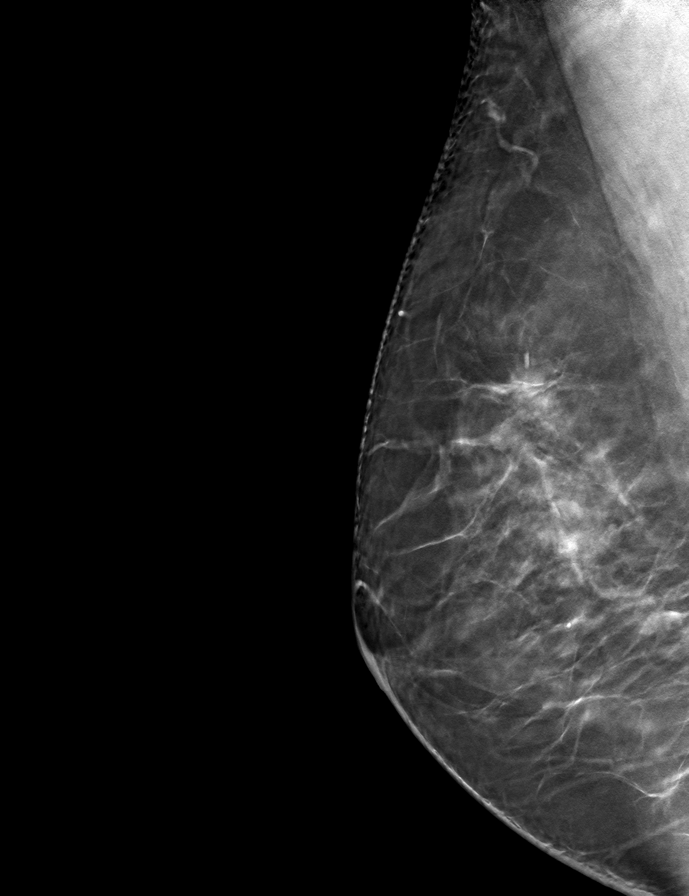

[8 of 24 positions shown; findings below may reference images not displayed]

ACR Breast Density Category c: The breast tissue is heterogeneously
dense, which may obscure small masses.
FINDINGS: In the upper-outer quadrant of the right breast there are multiple
small benign-appearing lymph nodes. There is a persistent obscured
mass versus asymmetry in the posterior depth which measures 6 mm. In
the upper-outer quadrant of the left breast there is an oval
circumscribed mass in the anterior depth measuring 9 mm.

Mammographic images were processed with CAD.

Ultrasound of the right breast at [DATE], 4 cm from the nipple
demonstrates a benign normal-appearing lymph node. Both at 9 o'clock
2 cm from the nipple and 11 o'clock, 4 cm from the nipple there are
more multiple benign-appearing cysts.

Ultrasound of the left breast at 1 o'clock, 2 cm from the nipple
demonstrates an anechoic oval circumscribed mass consistent with a
benign cyst measuring 1.2 cm.
IMPRESSION: 1. There are benign cysts in the right and left breast accounting
for the mass identified in the left breast in the asymmetry
identified in the right breast on the screening mammogram.

RECOMMENDATION:
Screening mammogram in one year.(Code:E7-D-GNS)

I have discussed the findings and recommendations with the patient.
Results were also provided in writing at the conclusion of the
visit. If applicable, a reminder letter will be sent to the patient
regarding the next appointment.

BI-RADS CATEGORY  2: Benign.

## 2020-03-09 IMAGING — US ULTRASOUND RIGHT BREAST LIMITED
1 series · 12 of 12 positions shown · non-contrast
Comparison: Previous exam(s).

CLINICAL DATA: Screening recall for a left breast mass and a right
breast asymmetry.

EXAM:
DIGITAL DIAGNOSTIC RIGHT MAMMOGRAM WITH CAD AND TOMO
ULTRASOUND BILATERAL BREAST

[Series 1: ultrasound right breast limited · 0.05mm/px · 12 of 12 slices shown]
[im 1/12]
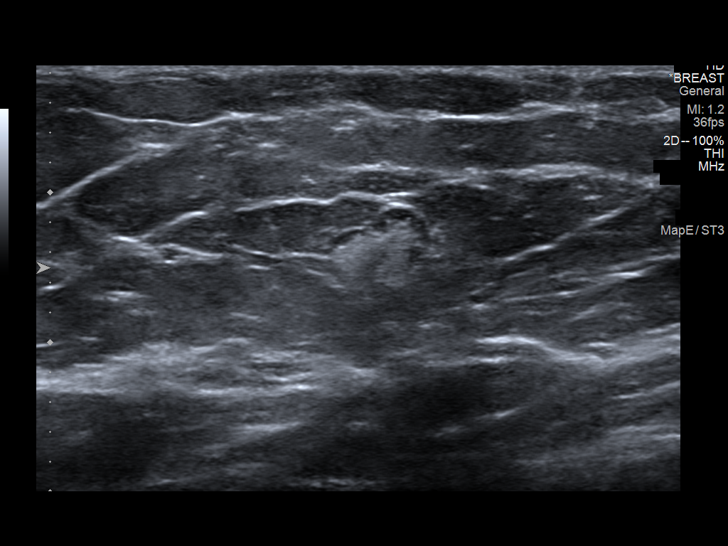
[im 2/12]
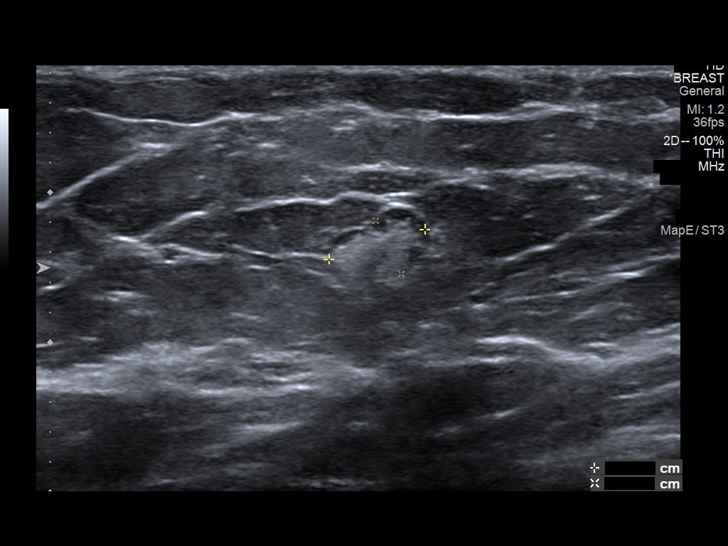
[im 3/12]
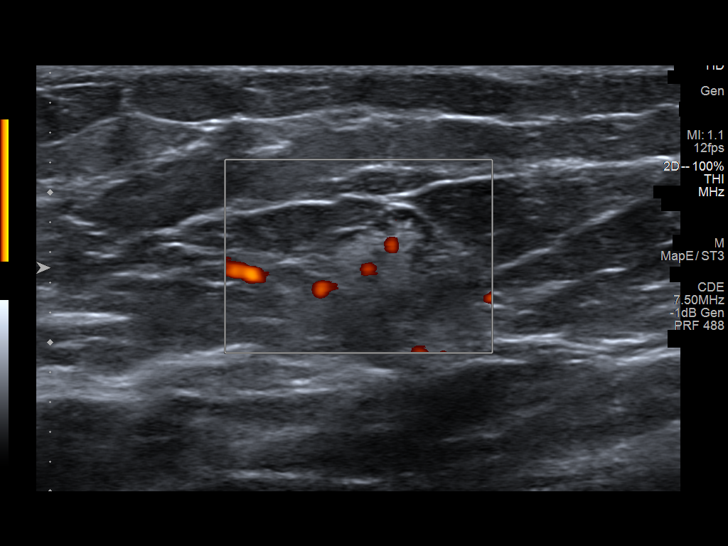
[im 4/12]
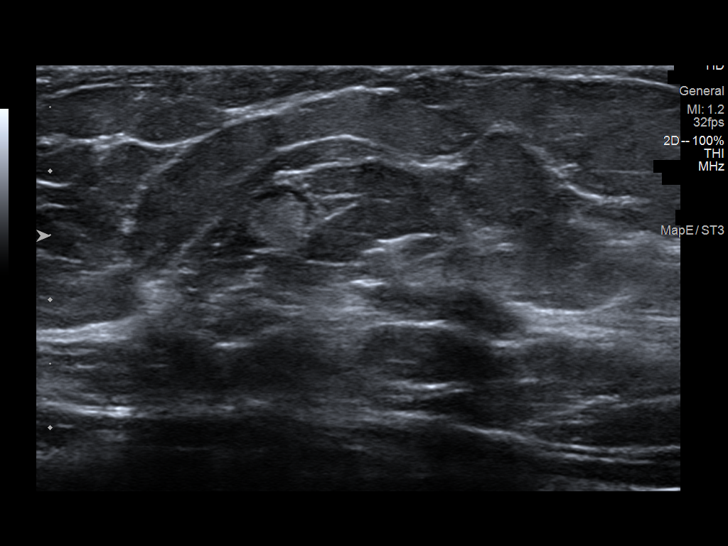
[im 5/12]
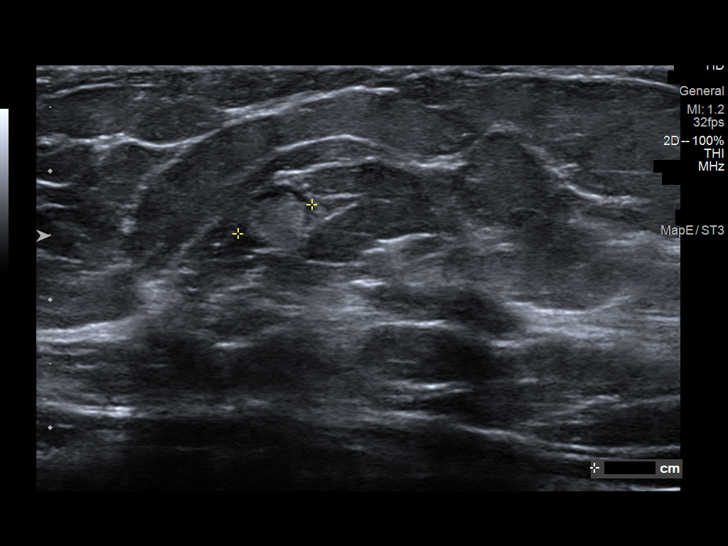
[im 6/12]
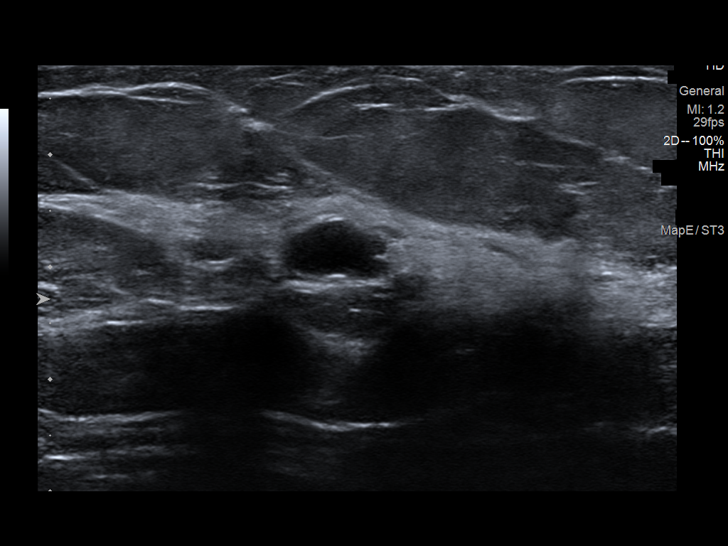
[im 7/12]
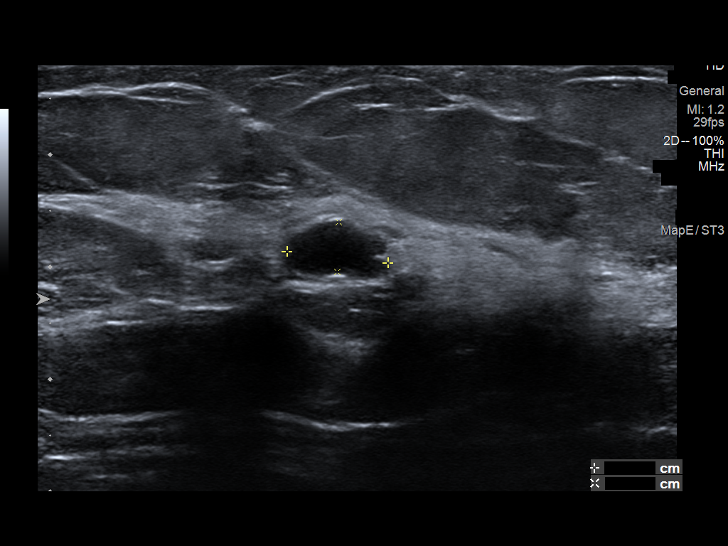
[im 8/12]
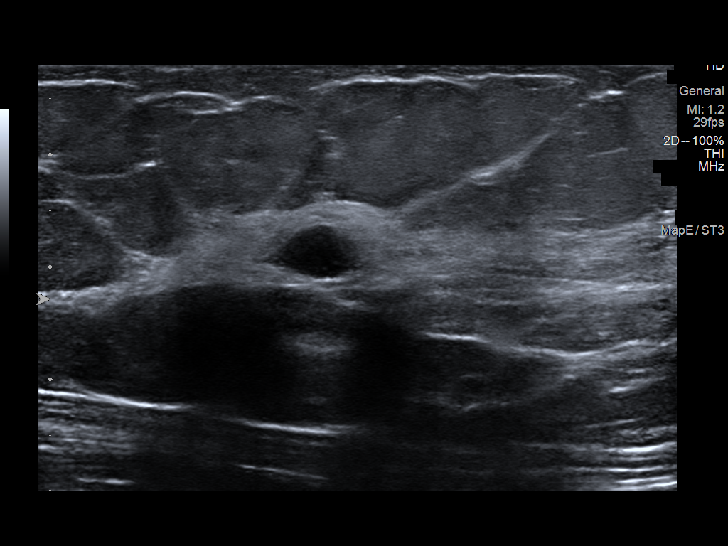
[im 9/12]
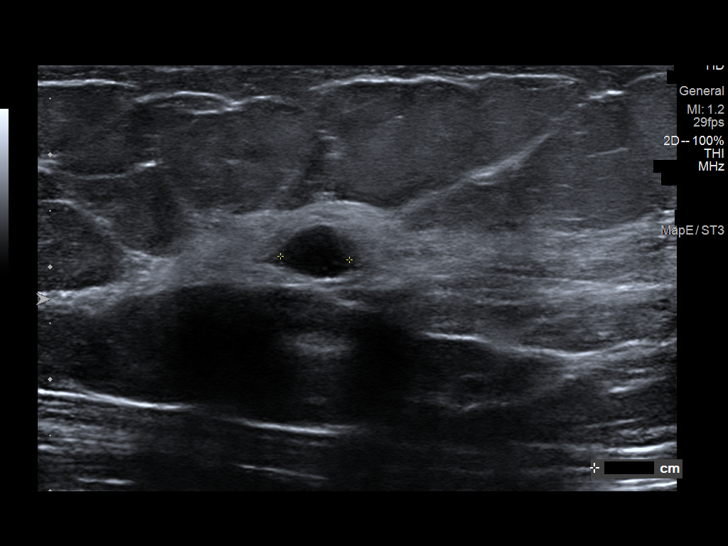
[im 10/12]
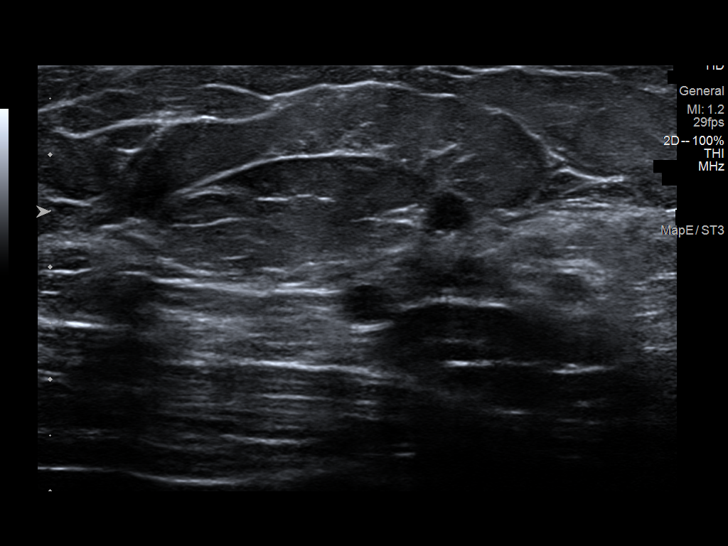
[im 11/12]
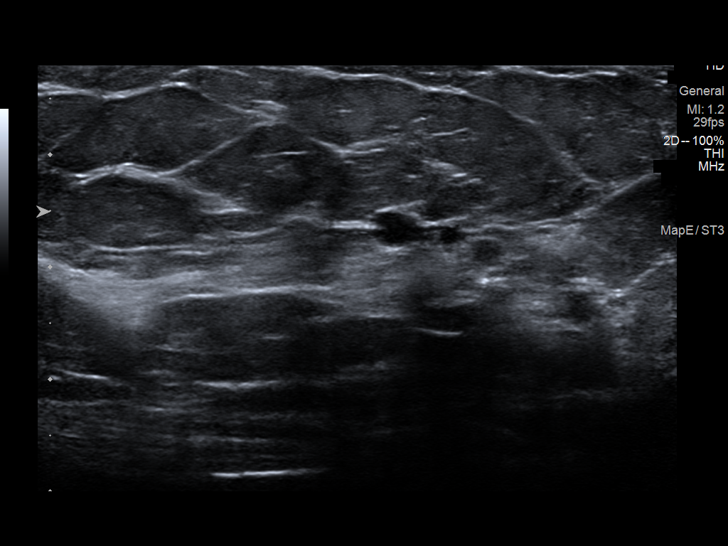
[im 12/12]
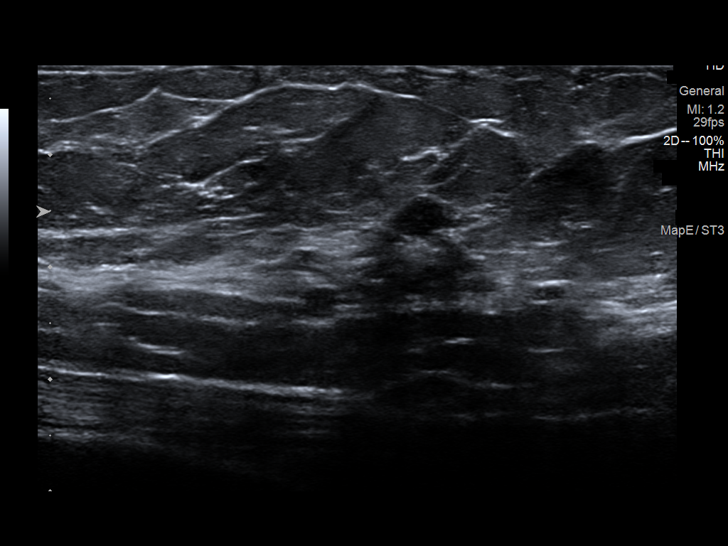

[12 of 12 positions shown; findings below may reference images not displayed]

ACR Breast Density Category c: The breast tissue is heterogeneously
dense, which may obscure small masses.
FINDINGS: In the upper-outer quadrant of the right breast there are multiple
small benign-appearing lymph nodes. There is a persistent obscured
mass versus asymmetry in the posterior depth which measures 6 mm. In
the upper-outer quadrant of the left breast there is an oval
circumscribed mass in the anterior depth measuring 9 mm.

Mammographic images were processed with CAD.

Ultrasound of the right breast at [DATE], 4 cm from the nipple
demonstrates a benign normal-appearing lymph node. Both at 9 o'clock
2 cm from the nipple and 11 o'clock, 4 cm from the nipple there are
more multiple benign-appearing cysts.

Ultrasound of the left breast at 1 o'clock, 2 cm from the nipple
demonstrates an anechoic oval circumscribed mass consistent with a
benign cyst measuring 1.2 cm.
IMPRESSION: 1. There are benign cysts in the right and left breast accounting
for the mass identified in the left breast in the asymmetry
identified in the right breast on the screening mammogram.

RECOMMENDATION:
Screening mammogram in one year.(Code:E7-D-GNS)

I have discussed the findings and recommendations with the patient.
Results were also provided in writing at the conclusion of the
visit. If applicable, a reminder letter will be sent to the patient
regarding the next appointment.

BI-RADS CATEGORY  2: Benign.
